# Patient Record
Sex: Female | Born: 1935 | Race: White | Hispanic: No | Smoking: Never smoker
Health system: Southern US, Community
[De-identification: ages and names within clinical notes are randomized; demographics above are authoritative.]

## PROBLEM LIST (undated history)

## (undated) DIAGNOSIS — C3490 Malignant neoplasm of unspecified part of unspecified bronchus or lung: Secondary | ICD-10-CM

## (undated) DIAGNOSIS — K219 Gastro-esophageal reflux disease without esophagitis: Secondary | ICD-10-CM

## (undated) DIAGNOSIS — I10 Essential (primary) hypertension: Secondary | ICD-10-CM

## (undated) DIAGNOSIS — N289 Disorder of kidney and ureter, unspecified: Secondary | ICD-10-CM

## (undated) DIAGNOSIS — N390 Urinary tract infection, site not specified: Secondary | ICD-10-CM

## (undated) DIAGNOSIS — E119 Type 2 diabetes mellitus without complications: Secondary | ICD-10-CM

## (undated) DIAGNOSIS — E1165 Type 2 diabetes mellitus with hyperglycemia: Secondary | ICD-10-CM

## (undated) DIAGNOSIS — J42 Unspecified chronic bronchitis: Secondary | ICD-10-CM

## (undated) DIAGNOSIS — I4891 Unspecified atrial fibrillation: Secondary | ICD-10-CM

## (undated) DIAGNOSIS — I498 Other specified cardiac arrhythmias: Secondary | ICD-10-CM

## (undated) DIAGNOSIS — E7800 Pure hypercholesterolemia, unspecified: Secondary | ICD-10-CM

## (undated) DIAGNOSIS — M1990 Unspecified osteoarthritis, unspecified site: Secondary | ICD-10-CM

## (undated) DIAGNOSIS — E079 Disorder of thyroid, unspecified: Secondary | ICD-10-CM

## (undated) DIAGNOSIS — E230 Hypopituitarism: Secondary | ICD-10-CM

## (undated) DIAGNOSIS — E237 Disorder of pituitary gland, unspecified: Secondary | ICD-10-CM

## (undated) HISTORY — DX: Disorder of kidney and ureter, unspecified: N28.9

## (undated) HISTORY — DX: Disorder of thyroid, unspecified: E07.9

## (undated) HISTORY — DX: Malignant neoplasm of unspecified part of unspecified bronchus or lung: C34.90

## (undated) HISTORY — DX: Urinary tract infection, site not specified: N39.0

## (undated) HISTORY — PX: KNEE SURGERY: SHX244

## (undated) HISTORY — DX: Type 2 diabetes mellitus with hyperglycemia: E11.65

## (undated) HISTORY — DX: Pure hypercholesterolemia, unspecified: E78.00

## (undated) HISTORY — DX: Other specified cardiac arrhythmias: I49.8

## (undated) HISTORY — DX: Hypopituitarism: E23.0

## (undated) HISTORY — DX: Disorder of pituitary gland, unspecified: E23.7

## (undated) HISTORY — PX: JOINT REPLACEMENT: SHX530

## (undated) HISTORY — DX: Unspecified osteoarthritis, unspecified site: M19.90

## (undated) HISTORY — DX: Unspecified chronic bronchitis: J42

---

## 2011-06-11 ENCOUNTER — Other Ambulatory Visit: Payer: Self-pay | Admitting: Internal Medicine

## 2011-06-11 DIAGNOSIS — R300 Dysuria: Secondary | ICD-10-CM

## 2011-06-11 DIAGNOSIS — N390 Urinary tract infection, site not specified: Secondary | ICD-10-CM

## 2011-06-18 ENCOUNTER — Other Ambulatory Visit: Payer: Self-pay

## 2011-06-26 ENCOUNTER — Ambulatory Visit
Admission: RE | Admit: 2011-06-26 | Discharge: 2011-06-26 | Disposition: A | Discharge status: Home / Self Care | Payer: Medicare Other | Source: Ambulatory Visit | Attending: Internal Medicine | Admitting: Internal Medicine

## 2011-06-26 DIAGNOSIS — R300 Dysuria: Secondary | ICD-10-CM

## 2011-06-26 DIAGNOSIS — N390 Urinary tract infection, site not specified: Secondary | ICD-10-CM

## 2012-02-10 ENCOUNTER — Encounter (HOSPITAL_BASED_OUTPATIENT_CLINIC_OR_DEPARTMENT_OTHER): Payer: Self-pay | Admitting: *Deleted

## 2012-02-10 ENCOUNTER — Emergency Department (HOSPITAL_BASED_OUTPATIENT_CLINIC_OR_DEPARTMENT_OTHER)
Admission: EM | Admit: 2012-02-10 | Discharge: 2012-02-10 | Disposition: A | Discharge status: Home / Self Care | Payer: Medicare Other | Attending: Emergency Medicine | Admitting: Emergency Medicine

## 2012-02-10 DIAGNOSIS — R109 Unspecified abdominal pain: Secondary | ICD-10-CM | POA: Insufficient documentation

## 2012-02-10 DIAGNOSIS — K219 Gastro-esophageal reflux disease without esophagitis: Secondary | ICD-10-CM | POA: Insufficient documentation

## 2012-02-10 DIAGNOSIS — E119 Type 2 diabetes mellitus without complications: Secondary | ICD-10-CM | POA: Insufficient documentation

## 2012-02-10 DIAGNOSIS — R300 Dysuria: Secondary | ICD-10-CM | POA: Insufficient documentation

## 2012-02-10 DIAGNOSIS — R350 Frequency of micturition: Secondary | ICD-10-CM | POA: Insufficient documentation

## 2012-02-10 DIAGNOSIS — Z79899 Other long term (current) drug therapy: Secondary | ICD-10-CM | POA: Insufficient documentation

## 2012-02-10 DIAGNOSIS — Z87440 Personal history of urinary (tract) infections: Secondary | ICD-10-CM | POA: Insufficient documentation

## 2012-02-10 DIAGNOSIS — R3589 Other polyuria: Secondary | ICD-10-CM | POA: Insufficient documentation

## 2012-02-10 DIAGNOSIS — I10 Essential (primary) hypertension: Secondary | ICD-10-CM | POA: Insufficient documentation

## 2012-02-10 DIAGNOSIS — R358 Other polyuria: Secondary | ICD-10-CM | POA: Insufficient documentation

## 2012-02-10 DIAGNOSIS — Z8679 Personal history of other diseases of the circulatory system: Secondary | ICD-10-CM | POA: Insufficient documentation

## 2012-02-10 HISTORY — DX: Unspecified atrial fibrillation: I48.91

## 2012-02-10 HISTORY — DX: Type 2 diabetes mellitus without complications: E11.9

## 2012-02-10 HISTORY — DX: Gastro-esophageal reflux disease without esophagitis: K21.9

## 2012-02-10 HISTORY — DX: Essential (primary) hypertension: I10

## 2012-02-10 LAB — CBC WITH DIFFERENTIAL/PLATELET
Hemoglobin: 13 g/dL (ref 12.0–15.0)
Lymphocytes Relative: 16 % (ref 12–46)
Lymphs Abs: 1.3 10*3/uL (ref 0.7–4.0)
Monocytes Relative: 7 % (ref 3–12)
Neutrophils Relative %: 75 % (ref 43–77)
Platelets: 228 10*3/uL (ref 150–400)
RBC: 4.5 MIL/uL (ref 3.87–5.11)
WBC: 8.1 10*3/uL (ref 4.0–10.5)

## 2012-02-10 LAB — BASIC METABOLIC PANEL
CO2: 24 mEq/L (ref 19–32)
Calcium: 9.2 mg/dL (ref 8.4–10.5)
GFR calc non Af Amer: 36 mL/min — ABNORMAL LOW (ref >90)
Potassium: 4.5 mEq/L (ref 3.5–5.1)
Sodium: 133 mEq/L — ABNORMAL LOW (ref 135–145)

## 2012-02-10 LAB — URINALYSIS, ROUTINE W REFLEX MICROSCOPIC
Glucose, UA: NEGATIVE mg/dL
Hgb urine dipstick: NEGATIVE
Protein, ur: NEGATIVE mg/dL
Specific Gravity, Urine: 1.012 (ref 1.005–1.030)

## 2012-02-10 LAB — URINE MICROSCOPIC-ADD ON

## 2012-02-10 MED ORDER — CEPHALEXIN 250 MG PO CAPS
500.0000 mg | ORAL_CAPSULE | Freq: Once | ORAL | Status: AC
  Administered 2012-02-10: 500 mg via ORAL
  Filled 2012-02-10 (×2): qty 1

## 2012-02-10 MED ORDER — CEPHALEXIN 500 MG PO CAPS: 500.0000 mg | ORAL_CAPSULE | Freq: Two times a day (BID) | ORAL | Status: AC

## 2012-02-10 MED ORDER — SODIUM CHLORIDE 0.9 % IV BOLUS (SEPSIS): 1000.0000 mL | Freq: Once | INTRAVENOUS | Status: DC

## 2012-02-10 NOTE — ED Notes (Signed)
MD at bedside. 

## 2012-02-10 NOTE — ED Notes (Addendum)
Windell Moulding RN at bedside now.

## 2012-02-10 NOTE — ED Notes (Signed)
In to start IV for NS bolus-pt states she was just up to BR and no void-daughter states she thought pt was getting abx and d/c-EDP Jeraldine Loots notified-states he will go in to talk with pt and daughter-pt and daughter notified

## 2012-02-10 NOTE — ED Notes (Signed)
Pt states unable to void x 24 hrs , sent here from urgent care fir eval

## 2012-02-10 NOTE — ED Notes (Signed)
Daughter upset about the wait. This RN in to room to adjust bed for pt and explain and apologize for delay. MD made aware of situation.

## 2012-02-10 NOTE — ED Provider Notes (Signed)
History     CSN: [id number]  Arrival date & time 02/10/12  1839   First MD Initiated Contact with Patient 02/10/12 1916      Chief Complaint  Patient presents with  . Urinary Retention    (Consider location/radiation/quality/duration/timing/severity/associated sxs/prior treatment) HPI The patient presents with a concern of one day of lack of urination.  She states that yesterday, without clear precipitant, she had polyuria.  Over the night, last night, she urinated multiple times, and was unable to sleep until approximately 12 hours ago.  Upon awakening approximately 6 hours ago, the patient felt that she was having difficulty producing urine.  There was lower abdominal discomfort.  These symptoms persisted throughout the time prior to arrival here, in spite of drinking copious amounts of fluids.  She arrival here, the patient purchased urine for a specimen.  She notes that she continues to have diffuse lower abdominal pain, this is nonradiating, not improved by, nor exacerbated by anything in particular. The patient denies fevers, chills, vomiting, diarrhea. The patient has a history of urinary tract infections, states that this feels somewhat similar. She denies other focal complaints, including chest pain, dyspnea, lightheadedness, syncope, weakness in any extremity.  Past Medical History  Diagnosis Date  . Diabetes mellitus without complication   . Hypertension   . Atrial fibrillation   . GERD (gastroesophageal reflux disease)     Past Surgical History  Procedure Date  . Joint replacement     History reviewed. No pertinent family history.  History  Substance Use Topics  . Smoking status: Never Smoker   . Smokeless tobacco: Not on file  . Alcohol Use: No    OB History    Grav Para Term Preterm Abortions TAB SAB Ect Mult Living                  Review of Systems  Constitutional:       Per HPI, otherwise negative  HENT:       Per HPI, otherwise negative  Eyes:  Negative.   Respiratory:       Per HPI, otherwise negative  Cardiovascular:       Per HPI, otherwise negative  Gastrointestinal: Negative for vomiting.  Genitourinary:       History of present illness  Musculoskeletal:       Per HPI, otherwise negative  Skin: Negative.   Neurological: Negative for syncope.    Allergies  Ciprofloxacin hcl; Codeine; and Sulfa antibiotics  Home Medications   Current Outpatient Rx  Name  Route  Sig  Dispense  Refill  . AMLODIPINE BESYLATE 5 MG PO TABS   Oral   Take 5 mg by mouth daily.         Marland Kitchen DILTIAZEM HCL ER BEADS 120 MG PO CP24   Oral   Take 120 mg by mouth 4 (four) times daily.         Marland Kitchen GLIPIZIDE 5 MG PO TABS   Oral   Take 5 mg by mouth 2 (two) times daily before a meal.         . HYDROCODONE-ACETAMINOPHEN 5-500 MG PO TABS   Oral   Take 1 tablet by mouth every 6 (six) hours as needed.         Marland Kitchen HYDROCORTISONE 5 MG PO TABS   Oral   Take 10 mg by mouth daily.         Marland Kitchen LEVOTHYROXINE SODIUM 75 MCG PO TABS   Oral   Take 75  mcg by mouth daily.         Marland Kitchen LISINOPRIL 40 MG PO TABS   Oral   Take 40 mg by mouth daily.         Marland Kitchen OMEPRAZOLE 40 MG PO CPDR   Oral   Take 40 mg by mouth daily.         . QUETIAPINE FUMARATE 50 MG PO TABS   Oral   Take 50 mg by mouth at bedtime.           BP 134/61  Pulse 65  Temp 98 F (36.7 C) (Oral)  Resp 18  Ht 5\' 3"  (1.6 m)  Wt 160 lb (72.576 kg)  BMI 28.34 kg/m2  SpO2 98%  Physical Exam  Nursing note and vitals reviewed. Constitutional: She is oriented to person, place, and time. She appears well-developed and well-nourished. No distress.  HENT:  Head: Normocephalic and atraumatic.  Eyes: Conjunctivae normal and EOM are normal.  Cardiovascular: Normal rate and regular rhythm.   Pulmonary/Chest: Effort normal and breath sounds normal. No stridor. No respiratory distress.  Abdominal: She exhibits no distension. There is tenderness in the right lower quadrant,  suprapubic area and left lower quadrant. There is guarding. There is no rigidity.  Musculoskeletal: She exhibits no edema.  Neurological: She is alert and oriented to person, place, and time. No cranial nerve deficit.  Skin: Skin is warm and dry.  Psychiatric: She has a normal mood and affect.    ED Course  [place] bedside Date/Time: 02/10/2012 7:50 PM Performed by: Gerhard Munch Authorized by: Gerhard Munch Consent: Verbal consent obtained. Risks and benefits: risks, benefits and alternatives were discussed Consent given by: patient Patient identity confirmed: verbally with patient Time out: Immediately prior to procedure a "time out" was called to verify the correct patient, procedure, equipment, support staff and site/side marked as required. Preparation: Patient was prepped and draped in the usual sterile fashion. Local anesthesia used: no Patient sedated: no Patient tolerance: Patient tolerated the procedure well with no immediate complications. Comments: Ultrasound of lower abdomen demonstrates no free fluid intraperitoneally, trace amounts of urine in the bladder, no pain with palpation of the bladder. Procedure well tolerated   (including critical care time)  Labs Reviewed  URINALYSIS, ROUTINE W REFLEX MICROSCOPIC - Abnormal; Notable for the following:    APPearance CLOUDY (*)     Leukocytes, UA LARGE (*)     All other components within normal limits  URINE MICROSCOPIC-ADD ON - Abnormal; Notable for the following:    Bacteria, UA FEW (*)     All other components within normal limits  CBC WITH DIFFERENTIAL  BASIC METABOLIC PANEL  URINE CULTURE   No results found.   No diagnosis found.   9:08 PM Patient's labs demonstrate a creatinine of 1.3, with no prior lab values.  Given oliguria, there suspicion of dehydration.  NS started  Update: Patient's daughter requests ABX, no IVF.  She seems upset at time delays, though the patient has been here for less than three  hours.  She requests expedited departure. MDM  This elderly female presents with one day of decreased urination, dysuria and general abdominal discomfort.  On exam she is in no distress, with unremarkable vital signs.  The patient's abdomen is soft, with minimal tenderness to palpation about the lower abdomen.  At that ultrasound does not demonstrate urinary retention, and the patient was procedure involving the emergency department.  This decreased production, as well as the patient's endorsement of  polyuria in the day proceeds is suggestive of urethritis, with consideration of dehydration.  The patient's marginally elevated creatinine substantiates this.  Absent fever, distress, there is low suspicion for acute ongoing systemic infection, though the urine was suggestive of urinary tract infection.  The patient requested antibiotics for this, and this was accommodated.  After the patient's daughter requested expedited discharge, this was also accommodated.  With the patient's age, comorbidities, early possible UTI, urethritis, she was encouraged to follow up with her primary care physician tomorrow to insure appropriate ongoing care.        Gerhard Munch, MD 02/10/12 2203

## 2012-02-10 NOTE — ED Notes (Signed)
MD and Manuela Schwartz at bedside now.

## 2012-02-12 LAB — URINE CULTURE

## 2012-02-13 NOTE — ED Notes (Signed)
+  Urine. Patient treated with Keflex. Sensitive to same. Per protocol MD. °

## 2012-02-19 ENCOUNTER — Telehealth: Payer: Self-pay | Admitting: Internal Medicine

## 2012-02-19 NOTE — Telephone Encounter (Signed)
Pt is the mother of Garey Ham. Pt's daughter would like to know if you would except her as a patient.  She has Medicare [facility] supplement. Thank you.

## 2012-02-26 NOTE — Telephone Encounter (Signed)
Pt aware/kjh 

## 2012-02-26 NOTE — Telephone Encounter (Signed)
Not currently taking new medicare patients   . 

## 2012-04-11 ENCOUNTER — Encounter: Payer: Medicare Other | Admitting: Family Medicine
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
## 2012-04-12 ENCOUNTER — Ambulatory Visit: Payer: Medicare Other | Admitting: Family Medicine

## 2012-04-25 ENCOUNTER — Other Ambulatory Visit: Payer: Self-pay | Admitting: Internal Medicine

## 2012-05-16 ENCOUNTER — Other Ambulatory Visit: Payer: Self-pay

## 2012-05-16 ENCOUNTER — Ambulatory Visit (INDEPENDENT_AMBULATORY_CARE_PROVIDER_SITE_OTHER): Payer: Medicare Other | Admitting: Family Medicine

## 2012-05-16 ENCOUNTER — Encounter: Payer: Self-pay | Admitting: Family Medicine

## 2012-05-16 VITALS — BP 100/70 | HR 84 | Temp 98.8°F | Ht 62.75 in | Wt 166.0 lb

## 2012-05-16 DIAGNOSIS — L989 Disorder of the skin and subcutaneous tissue, unspecified: Secondary | ICD-10-CM

## 2012-05-16 DIAGNOSIS — R350 Frequency of micturition: Secondary | ICD-10-CM

## 2012-05-16 DIAGNOSIS — IMO0001 Reserved for inherently not codable concepts without codable children: Secondary | ICD-10-CM

## 2012-05-16 DIAGNOSIS — I10 Essential (primary) hypertension: Secondary | ICD-10-CM | POA: Insufficient documentation

## 2012-05-16 DIAGNOSIS — E785 Hyperlipidemia, unspecified: Secondary | ICD-10-CM

## 2012-05-16 DIAGNOSIS — N390 Urinary tract infection, site not specified: Secondary | ICD-10-CM

## 2012-05-16 DIAGNOSIS — C3490 Malignant neoplasm of unspecified part of unspecified bronchus or lung: Secondary | ICD-10-CM

## 2012-05-16 DIAGNOSIS — E230 Hypopituitarism: Secondary | ICD-10-CM | POA: Insufficient documentation

## 2012-05-16 HISTORY — DX: Hypopituitarism: E23.0

## 2012-05-16 HISTORY — DX: Reserved for inherently not codable concepts without codable children: IMO0001

## 2012-05-16 LAB — BASIC METABOLIC PANEL
CO2: 31 mEq/L (ref 19–32)
Chloride: 98 mEq/L (ref 96–112)
GFR: 37.97 mL/min — ABNORMAL LOW (ref >60.00)
Glucose, Bld: 157 mg/dL — ABNORMAL HIGH (ref 70–99)
Potassium: 4.8 mEq/L (ref 3.5–5.1)
Sodium: 135 mEq/L (ref 135–145)

## 2012-05-16 LAB — LIPID PANEL
HDL: 51.1 mg/dL (ref >39.00)
LDL Cholesterol: 85 mg/dL (ref 0–99)
VLDL: 38.2 mg/dL (ref 0.0–40.0)

## 2012-05-16 LAB — HEMOGLOBIN A1C: Hgb A1c MFr Bld: 6.9 % — ABNORMAL HIGH (ref 4.6–6.5)

## 2012-05-16 LAB — POCT URINALYSIS DIPSTICK
Bilirubin, UA: NEGATIVE
Spec Grav, UA: 1.02
Urobilinogen, UA: 0.2

## 2012-05-16 MED ORDER — CEFIXIME 400 MG PO TABS: 400.0000 mg | ORAL_TABLET | Freq: Every day | ORAL | Status: DC

## 2012-05-16 NOTE — Addendum Note (Signed)
Addended by: Azucena Freed on: 05/16/2012 01:59 PM   Modules accepted: Orders

## 2012-05-16 NOTE — Progress Notes (Signed)
Chief Complaint  Patient presents with  . Establish Care    HPI:  [name] [name] is here to establish care. Moved to [place] from [place] a year ago. Saw a PCP here, but did like her (can not remember the name of this doctor.)  She has poor memory and is a very poor historian. She reports she feels fine other then dysuria. Denies CP, SOB, palpitations, swelling, fevers, chills, GI bleeding, bowel changes.  Has the following chronic problems and concerns today:  Dysuria: -has had dysuria for a few days - hurts when urinates -reports went to urgent care place in the past and given abx -has had dysuria on and off for about 1 year - also told needs to drink more water and reports recent PCP told her it was not a UTI and was her nerves - this made her upset - she wants to see a urologist  DM: -home BS: normally 120-180 after eating breakfast -takes glipizide for her diabetes  Panhypopituitarism/Hypothyroid: -reports has pituitary issue and saw endocrinologist in the past for 20 years for this and her diabetes -takes hydrocortisone 20mg  daily in the am and 10mg  in the evening -wants to see endocrinologist here  Paroxsysmal A. Fib: -on diltiazem, report in the hospital for this in the past -reports might have taken asa, but thinks stopped due to nose bleed - she just stopped it -she stopped it  Skin lesion: -on cheek - has been there a long time - wants to see neurologist  Insomnia: -reports previous doc  OA: -knee pain -takes vicodin once daily, this does cause constipation   Patient Active Problem List  Diagnosis  . Type II or unspecified type diabetes mellitus without mention of complication, uncontrolled  . Lung cancer - followed by Dr. Mardi Mainland at [facility]  . Essential hypertension, benign  . Hyperlipemia  . Panhypopituitarism     Health Maintenance: -reports has had pneumonia vaccine in the last 3 years, can't remember last tdap -colonoscopy in 2011 -mammo -  stopped getting these  ROS: See pertinent positives and negatives per HPI.  Past Medical History  Diagnosis Date  . Diabetes mellitus without complication   . Hypertension   . Atrial fibrillation   . GERD (gastroesophageal reflux disease)   . 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 Arthritis   . Lung cancer   . Chronic bronchitis   . Heart fibrillation   . Hypertension   . High cholesterol   . Kidney disease   . Thyroid disease   . UTI (urinary tract infection)   . 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 Type II or unspecified type diabetes mellitus without mention of complication, uncontrolled 05/16/2012    Family History  Problem Relation Age of Onset  . Alcohol abuse      parent  . Arthritis      parent  . Lung cancer    . High Cholesterol    . Heart disease    . Hypertension    . Emotional abuse    . Diabetes      History   Social History  . Marital Status: Widowed    Spouse Name: N/A    Number of Children: N/A  . Years of Education: N/A   Social History Main Topics  . Smoking status: Never Smoker   . Smokeless tobacco: None  . Alcohol Use: No  . Drug Use: No  . Sexually Active: No   Other Topics Concern  .  None   Social History Narrative   Work or School: none      Home Situation: lives alone      Spiritual Beliefs: has not been going to church      Lifestyle: no regular exercise, eats out usually, takes care of of her own bathing and dressing, daughter is taking care of finances   HCPOA: Garey Ham [phone]             Current outpatient prescriptions:amLODipine (NORVASC) 5 MG tablet, TAKE 1 TABLET BY MOUTH DAILY, Disp: 90 tablet, Rfl: 3;  atorvastatin (LIPITOR) 10 MG tablet, Take 10 mg by mouth daily., Disp: , Rfl: ;  Calcium Polycarbophil (FIBER) 625 MG TABS, Take 2 tablets by mouth at bedtime., Disp: , Rfl: ;  diltiazem (TIAZAC) 120 MG 24 hr capsule, Take 120 mg by mouth 4 (four) times daily., Disp: , Rfl:  glipiZIDE  (GLUCOTROL) 5 MG tablet, Take 5 mg by mouth 2 (two) times daily before a meal., Disp: , Rfl: ;  HYDROcodone-acetaminophen (VICODIN) 5-500 MG per tablet, Take 1 tablet by mouth every 6 (six) hours as needed., Disp: , Rfl: ;  hydrocortisone (CORTEF) 5 MG tablet, Take 20 mg by mouth. Take 1 tablet by mouth every morning and 1/2 tablet every evening., Disp: , Rfl:  levothyroxine (SYNTHROID, LEVOTHROID) 75 MCG tablet, Take 75 mcg by mouth daily., Disp: , Rfl: ;  lisinopril (PRINIVIL,ZESTRIL) 40 MG tablet, Take 40 mg by mouth daily., Disp: , Rfl: ;  omeprazole (PRILOSEC) 40 MG capsule, Take 40 mg by mouth daily., Disp: , Rfl: ;  QUEtiapine (SEROQUEL) 50 MG tablet, Take 50 mg by mouth at bedtime., Disp: , Rfl: ;  cefixime (SUPRAX) 400 MG tablet, Take 1 tablet (400 mg total) by mouth daily., Disp: 5 tablet, Rfl: 0  EXAM:  Filed Vitals:   05/16/12 1135  BP: 100/70  Pulse: 84  Temp: 98.8 F (37.1 C)    Body mass index is 29.63 kg/(m^2).  GENERAL: vitals reviewed and listed above, alert, oriented, appears well hydrated and in no acute distress  HEENT: atraumatic, conjunttiva clear, no obvious abnormalities on inspection of external nose and ears  NECK: no obvious masses on inspection  LUNGS: clear to auscultation bilaterally, no wheezes, rales or rhonchi, good air movement  CV: HRRR, no peripheral edema  MS: moves all extremities without noticeable abnormality  PSYCH: pleasant and cooperative, no obvious depression or anxiety  ASSESSMENT AND PLAN:  Discussed the following assessment and plan:  Urinary frequency - Plan: POCT urinalysis dipstick, cefixime (SUPRAX) 400 MG tablet  Type II or unspecified type diabetes mellitus without mention of complication, uncontrolled - Plan: Ambulatory referral to Endocrinology, Hemoglobin A1c  Lung cancer, unspecified laterality  Essential hypertension, benign - Plan: Basic metabolic panel  Hyperlipemia - Plan: Lipid Panel  Panhypopituitarism - Plan:  Ambulatory referral to Endocrinology  UTI (urinary tract infection)  Skin lesion - Plan: Ambulatory referral to Dermatology   Very complicated patient with poor hearing, possible dementia and poor understanding of health issues. Advised she see a psychiatrist.  We will obtain records and I am going to call her daughter to try to figure out more about what is going on with her health and to advise a geriatrician. Have advised asa daily in the meantime, seems from her story she stopped this on her own. Placed referrals per her request for endocrinology for her ?panhypopit and diabetes, and to derm for lesion on her face. Udip with likely UTI - abx prescribed  and culture pending Baseline labs today per orders. Advised seroquel may not be the safest medication for her sleep issues and advised she see psych for this. Adivsed weaning down on vicodin as is causing bowel issues and is not a good medication for chornic MS pain in the elderly particularly. I feel given her complexity and age she would do better with a geriatrician and have advised this - will call daughter and make this recommendation. Will have her follow up with me in the meantime until establishes with a geriatrician. >45 minutes spent face to face with this patient -Patient advised to return or notify a doctor immediately if symptoms worsen or persist or new concerns arise.  Patient Instructions  -We have ordered labs or studies at this visit. It can take up to 1-2 weeks for results and processing. We will contact you with instructions IF your results are abnormal. Normal results will be released to your [facility]. If you have not heard from [place] or can not find your results in [facility] in 2 weeks please contact our office.  -PLEASE SIGN UP FOR MYCHART TODAY   We recommend the following healthy lifestyle measures: - eat a healthy diet consisting of lots of vegetables, fruits, beans, nuts, seeds, healthy meats such as white chicken and  fish and whole grains.  - avoid fried foods, fast food, processed foods, sodas, red meet and other fattening foods.  - get a least 150 minutes of aerobic exercise per week.   Start taking 1 aspirin daily  Decrease the vicodin to every other day, take just tylenol on days you do not take this, we will wean off this medication  Stop the seroquel. Schedule appointment with psychiatry for your sleep issues.  Follow up in: 1 month    Addendum: Spoke with daughter. She does not think patient has dementia and thinks she is fine to drive. She reports patient had a geriatrician - but can not remember the name and patient had difficulty finding this office so did not want to go there anymore. Advised she needs to re-estabish with a geriatrician. Advised of recs from today. Will have scheduler contact daughter with details regarding referral appointments.    Kriste Basque R.

## 2012-05-16 NOTE — Addendum Note (Signed)
Addended by: Rita Ohara R on: 05/16/2012 02:05 PM   Modules accepted: Orders

## 2012-05-16 NOTE — Patient Instructions (Addendum)
-  We have ordered labs or studies at this visit. It can take up to 1-2 weeks for results and processing. We will contact you with instructions IF your results are abnormal. Normal results will be released to your [facility]. If you have not heard from [place] or can not find your results in [facility] in 2 weeks please contact our office.  -PLEASE SIGN UP FOR MYCHART TODAY   We recommend the following healthy lifestyle measures: - eat a healthy diet consisting of lots of vegetables, fruits, beans, nuts, seeds, healthy meats such as white chicken and fish and whole grains.  - avoid fried foods, fast food, processed foods, sodas, red meet and other fattening foods.  - get a least 150 minutes of aerobic exercise per week.   Start taking 1 aspirin daily  Decrease the vicodin to every other day, take just tylenol on days you do not take this, we will wean off this medication  Stop the seroquel. Schedule appointment with psychiatry for your sleep issues.  Follow up in: 1 month

## 2012-05-17 ENCOUNTER — Telehealth: Payer: Self-pay | Admitting: Family Medicine

## 2012-05-17 MED ORDER — NITROFURANTOIN MONOHYD MACRO 100 MG PO CAPS: 100.0000 mg | ORAL_CAPSULE | Freq: Three times a day (TID) | ORAL | Status: DC

## 2012-05-17 NOTE — Telephone Encounter (Signed)
Can do nitrofurantoin instead. Also, remind about need to establish with geriatrician. Discussed with daughter yesterday. Will help out until back with geriatrician. Thanks.

## 2012-05-17 NOTE — Telephone Encounter (Signed)
Pls advise.  

## 2012-05-17 NOTE — Telephone Encounter (Signed)
Called and spoke with pt's daughter and she is aware.  States pt went to pharmacy to get new medication this morning and advised medication was sent to pharmacy this afternoon.

## 2012-05-17 NOTE — Telephone Encounter (Addendum)
Pt can not afford suprax cost over 98.00. Please call something else into [facility] [place]

## 2012-05-17 NOTE — Telephone Encounter (Signed)
Called and spoke with pt's daughter and she is aware.

## 2012-05-17 NOTE — Telephone Encounter (Signed)
Patient calling back to see if decision was made regarding her antibiotic.  It was too expensive for the suprax $98.00.  She states she talked with someone in the office about an alternative and called her pharmacy and nothing has been called in yet.  Advised [name] [name] to please allow office time to discuss with Dr. Selena Batten. Understanding expressed. She would like a call when medication is sent.   [name] [name]-  [phone]

## 2012-05-17 NOTE — Telephone Encounter (Signed)
Labs are stable from a few months ago. Diabetes lab 6.9, continue currrent treatment. Kidney function about the same - make sure staying hydrated. Cholesterol ok.

## 2012-05-18 NOTE — Telephone Encounter (Signed)
Called and spoke with pt's daughter (ok per patient verbally). Pt is has difficulty hearing.  Pt's daughter is aware of lab results.

## 2012-05-18 NOTE — Telephone Encounter (Signed)
Left a message for return call.  

## 2012-05-23 ENCOUNTER — Telehealth: Payer: Self-pay | Admitting: Family Medicine

## 2012-05-23 NOTE — Telephone Encounter (Signed)
Please call daughter and patient. Patient needs to see a doctor have repeat urine and may be dehydrated - advise given phone message symptoms she needs to go to ED if feeling that bad - she could be seriously ill.

## 2012-05-23 NOTE — Telephone Encounter (Signed)
I spoke with pt's daughter and gave below information.

## 2012-05-23 NOTE — Telephone Encounter (Signed)
Patient Information:  Caller Name: Aleatha  Phone: [phone]  Patient: [name] [name]  Gender: Female  DOB: 02/24/1921  Age: 79 Years  PCP: Kriste Basque [facility])  Office Follow Up:  Does the office need to follow up with this patient?: Yes  Instructions For The Office: OFFICE PLEASE REVIEW AND CALL PATIENT WITH FURTHER INSTRUCTIONS  RN Note:  Offered appt but she states that she is too nauseated to get up and get dressed; family is at work now; pt states that she told the MD that she will need two rounds of antibiotic to help the UTI sx; but she can not take this current antibiotic that she is on.  Suggested calling 911 for transport and going into ED for treatment; pt refusing; states that she wants MD to call her in another antiboitc because one never helps  Explained that I will send a note for MD to review but if sx worsen call back or 911- pt responds that "I don't think the sx could get much worse"  Symptoms  Reason For Call & Symptoms: Pt states that she was seen in the office on 05/16/12 and dx with UTI; started  on antibiotic, Nitrofurantoin 100mg  threetimes daily for 7 days on 05/17/12; she is upset on her stomach from this medicine; refusing to take anymore of this medication;  last dose she took was today;  very nauseated; denies vomiting; taking Phenergan but not helping nausea; still having pain with urination and hard time sleeping from the increase in frequency; unsure if has a fever; no thermometer;  feels warm to touch per pt;  having severe pain with urination; pt is very hard of hearing; difficult to triage patient due to poor hearing  Reviewed Health History In EMR: Yes  Reviewed Medications In EMR: Yes  Reviewed Allergies In EMR: Yes  Reviewed Surgeries / Procedures: Yes  Date of Onset of Symptoms: 05/16/2012  Treatments Tried: Nitrofurantoin and Promethazine  Treatments Tried Worked: No  Guideline(s) Used:  Urination Pain - Female  Urinalysis Results  Follow-Up Call  Disposition Per Guideline:   Go to Office Now  Reason For Disposition Reached:   Severe pain with urination  Advice Given:  N/A  Patient Refused Recommendation:  Patient Requests Prescription  Pt is requesting antibiotic to be called in since she was just seen in the office and states that she is too nasueated to get dressed to come in to be seen

## 2012-05-23 NOTE — Telephone Encounter (Signed)
pls advise

## 2012-06-02 ENCOUNTER — Ambulatory Visit: Payer: Medicare Other | Admitting: Internal Medicine

## 2012-06-12 ENCOUNTER — Other Ambulatory Visit: Payer: Self-pay | Admitting: Internal Medicine

## 2012-06-15 ENCOUNTER — Other Ambulatory Visit: Payer: Self-pay

## 2012-06-15 MED ORDER — GLIPIZIDE ER 5 MG PO TB24: 5.0000 mg | ORAL_TABLET | Freq: Every day | ORAL | Status: DC

## 2012-06-16 ENCOUNTER — Ambulatory Visit: Payer: Medicare Other | Admitting: Family Medicine

## 2012-09-15 ENCOUNTER — Other Ambulatory Visit: Payer: Self-pay | Admitting: Family Medicine

## 2012-09-15 NOTE — Telephone Encounter (Signed)
Advised this patient and daughter to re-establish with her geriatrician or an internist for primary care. Would advise refill request be sent PCP.

## 2012-10-07 ENCOUNTER — Other Ambulatory Visit: Payer: Self-pay | Admitting: Internal Medicine

## 2012-10-07 NOTE — Telephone Encounter (Signed)
[name] [name],  I do not usually prescribe this medication, particularly in elderly pts and have not seen this pt in a very long time. I will not refill and advise she see geriatrician or psychiatrist. I advised this patient to re-establish with her geriatrician and advised would provide care short term until she got back with her geriatrician. We have advised her and her daughter of this multiple times. Please send discharge letter.Thanks.

## 2012-10-11 ENCOUNTER — Encounter: Payer: Self-pay | Admitting: Family Medicine

## 2012-10-12 ENCOUNTER — Other Ambulatory Visit: Payer: Self-pay | Admitting: *Deleted

## 2012-10-12 MED ORDER — QUETIAPINE FUMARATE 50 MG PO TABS: 50.0000 mg | ORAL_TABLET | Freq: Every day | ORAL | Status: AC

## 2012-10-13 ENCOUNTER — Telehealth: Payer: Self-pay | Admitting: Family Medicine

## 2012-10-13 NOTE — Telephone Encounter (Addendum)
Patient dismissed from [facility] by Kriste Basque MD , effective October 10, 2012. Dismissal letter sent out by certified / registered mail. DAJ  Received signed domestic return receipt verifying delivery of certified letter on October 17, 2012. Article number [id number]

## 2012-10-14 ENCOUNTER — Other Ambulatory Visit: Payer: Self-pay | Admitting: Internal Medicine

## 2012-12-18 ENCOUNTER — Encounter (HOSPITAL_COMMUNITY): Payer: Self-pay | Admitting: Emergency Medicine

## 2012-12-18 ENCOUNTER — Inpatient Hospital Stay (HOSPITAL_COMMUNITY)
Admission: EM | Admit: 2012-12-18 | Discharge: 2012-12-23 | DRG: 378 | Disposition: A | Discharge status: Home / Self Care | Payer: Medicare Other | Attending: Internal Medicine | Admitting: Internal Medicine

## 2012-12-18 ENCOUNTER — Emergency Department (HOSPITAL_COMMUNITY): Payer: Medicare Other

## 2012-12-18 DIAGNOSIS — I129 Hypertensive chronic kidney disease with stage 1 through stage 4 chronic kidney disease, or unspecified chronic kidney disease: Secondary | ICD-10-CM | POA: Diagnosis present

## 2012-12-18 DIAGNOSIS — N390 Urinary tract infection, site not specified: Secondary | ICD-10-CM | POA: Diagnosis present

## 2012-12-18 DIAGNOSIS — E7800 Pure hypercholesterolemia, unspecified: Secondary | ICD-10-CM | POA: Diagnosis present

## 2012-12-18 DIAGNOSIS — K921 Melena: Secondary | ICD-10-CM

## 2012-12-18 DIAGNOSIS — K264 Chronic or unspecified duodenal ulcer with hemorrhage: Principal | ICD-10-CM

## 2012-12-18 DIAGNOSIS — Z923 Personal history of irradiation: Secondary | ICD-10-CM

## 2012-12-18 DIAGNOSIS — N183 Chronic kidney disease, stage 3 unspecified: Secondary | ICD-10-CM

## 2012-12-18 DIAGNOSIS — Z79899 Other long term (current) drug therapy: Secondary | ICD-10-CM

## 2012-12-18 DIAGNOSIS — E785 Hyperlipidemia, unspecified: Secondary | ICD-10-CM

## 2012-12-18 DIAGNOSIS — C3490 Malignant neoplasm of unspecified part of unspecified bronchus or lung: Secondary | ICD-10-CM | POA: Diagnosis present

## 2012-12-18 DIAGNOSIS — I4891 Unspecified atrial fibrillation: Secondary | ICD-10-CM | POA: Diagnosis present

## 2012-12-18 DIAGNOSIS — D649 Anemia, unspecified: Secondary | ICD-10-CM

## 2012-12-18 DIAGNOSIS — D62 Acute posthemorrhagic anemia: Secondary | ICD-10-CM | POA: Diagnosis present

## 2012-12-18 DIAGNOSIS — N179 Acute kidney failure, unspecified: Secondary | ICD-10-CM | POA: Diagnosis present

## 2012-12-18 DIAGNOSIS — J42 Unspecified chronic bronchitis: Secondary | ICD-10-CM | POA: Diagnosis present

## 2012-12-18 DIAGNOSIS — I10 Essential (primary) hypertension: Secondary | ICD-10-CM

## 2012-12-18 DIAGNOSIS — K219 Gastro-esophageal reflux disease without esophagitis: Secondary | ICD-10-CM | POA: Diagnosis present

## 2012-12-18 DIAGNOSIS — G2581 Restless legs syndrome: Secondary | ICD-10-CM

## 2012-12-18 DIAGNOSIS — I2489 Other forms of acute ischemic heart disease: Secondary | ICD-10-CM

## 2012-12-18 DIAGNOSIS — M129 Arthropathy, unspecified: Secondary | ICD-10-CM | POA: Diagnosis present

## 2012-12-18 DIAGNOSIS — K922 Gastrointestinal hemorrhage, unspecified: Secondary | ICD-10-CM

## 2012-12-18 DIAGNOSIS — E230 Hypopituitarism: Secondary | ICD-10-CM

## 2012-12-18 DIAGNOSIS — IMO0001 Reserved for inherently not codable concepts without codable children: Secondary | ICD-10-CM

## 2012-12-18 DIAGNOSIS — I248 Other forms of acute ischemic heart disease: Secondary | ICD-10-CM | POA: Diagnosis present

## 2012-12-18 LAB — COMPREHENSIVE METABOLIC PANEL
CO2: 20 mEq/L (ref 19–32)
Calcium: 8.7 mg/dL (ref 8.4–10.5)
Creatinine, Ser: 1.49 mg/dL — ABNORMAL HIGH (ref 0.50–1.10)
GFR calc Af Amer: 35 mL/min — ABNORMAL LOW (ref >90)
GFR calc non Af Amer: 30 mL/min — ABNORMAL LOW (ref >90)
Glucose, Bld: 202 mg/dL — ABNORMAL HIGH (ref 70–99)

## 2012-12-18 LAB — GLUCOSE, CAPILLARY
Glucose-Capillary: 199 mg/dL — ABNORMAL HIGH (ref 70–99)
Glucose-Capillary: 168 mg/dL — ABNORMAL HIGH (ref 70–99)

## 2012-12-18 LAB — CBC WITH DIFFERENTIAL/PLATELET
Eosinophils Relative: 1 % (ref 0–5)
HCT: 18.9 % — ABNORMAL LOW (ref 36.0–46.0)
Lymphocytes Relative: 15 % (ref 12–46)
Lymphs Abs: 1.9 10*3/uL (ref 0.7–4.0)
MCV: 90.9 fL (ref 78.0–100.0)
Monocytes Absolute: 0.3 10*3/uL (ref 0.1–1.0)
RDW: 16 % — ABNORMAL HIGH (ref 11.5–15.5)
WBC: 12.6 10*3/uL — ABNORMAL HIGH (ref 4.0–10.5)

## 2012-12-18 LAB — POCT I-STAT TROPONIN I: Troponin i, poc: 0 ng/mL (ref 0.00–0.08)

## 2012-12-18 LAB — URINALYSIS, ROUTINE W REFLEX MICROSCOPIC
Protein, ur: NEGATIVE mg/dL
Urobilinogen, UA: 0.2 mg/dL (ref 0.0–1.0)

## 2012-12-18 LAB — OCCULT BLOOD, POC DEVICE: Fecal Occult Bld: POSITIVE — AB

## 2012-12-18 LAB — APTT: aPTT: 28 seconds (ref 24–37)

## 2012-12-18 LAB — TROPONIN I: Troponin I: <0.3 ng/mL (ref <0.30)

## 2012-12-18 LAB — PROTIME-INR: Prothrombin Time: 14.3 seconds (ref 11.6–15.2)

## 2012-12-18 LAB — URINE MICROSCOPIC-ADD ON

## 2012-12-18 LAB — PREPARE RBC (CROSSMATCH)

## 2012-12-18 MED ORDER — HYDROCORTISONE 10 MG PO TABS
10.0000 mg | ORAL_TABLET | Freq: Every day | ORAL | Status: DC
  Administered 2012-12-19 – 2012-12-23 (×5): 10 mg via ORAL
  Filled 2012-12-18 (×5): qty 1

## 2012-12-18 MED ORDER — DILTIAZEM HCL ER COATED BEADS 120 MG PO CP24
120.0000 mg | ORAL_CAPSULE | Freq: Every day | ORAL | Status: DC
  Administered 2012-12-19 – 2012-12-20 (×2): 120 mg via ORAL
  Filled 2012-12-18 (×3): qty 1

## 2012-12-18 MED ORDER — INSULIN ASPART 100 UNIT/ML ~~LOC~~ SOLN
0.0000 [IU] | Freq: Three times a day (TID) | SUBCUTANEOUS | Status: DC
  Administered 2012-12-18: 19:00:00 1 [IU] via SUBCUTANEOUS
  Administered 2012-12-19 – 2012-12-20 (×2): 2 [IU] via SUBCUTANEOUS

## 2012-12-18 MED ORDER — SODIUM CHLORIDE 0.9 % IV SOLN: INTRAVENOUS | Status: DC

## 2012-12-18 MED ORDER — DEXTROSE 5 % IV SOLN
1.0000 g | INTRAVENOUS | Status: DC
  Administered 2012-12-18 – 2012-12-22 (×5): 1 g via INTRAVENOUS
  Filled 2012-12-18 (×6): qty 10

## 2012-12-18 MED ORDER — QUETIAPINE FUMARATE 50 MG PO TABS
50.0000 mg | ORAL_TABLET | Freq: Every day | ORAL | Status: DC
  Administered 2012-12-18 – 2012-12-22 (×5): 50 mg via ORAL
  Filled 2012-12-18 (×6): qty 1

## 2012-12-18 MED ORDER — ACETAMINOPHEN 650 MG RE SUPP: 650.0000 mg | Freq: Four times a day (QID) | RECTAL | Status: DC | PRN

## 2012-12-18 MED ORDER — SODIUM CHLORIDE 0.9 % IJ SOLN
3.0000 mL | Freq: Two times a day (BID) | INTRAMUSCULAR | Status: DC
  Administered 2012-12-18 – 2012-12-22 (×6): 3 mL via INTRAVENOUS

## 2012-12-18 MED ORDER — SODIUM CHLORIDE 0.9 % IV SOLN
INTRAVENOUS | Status: DC
  Administered 2012-12-18: 18:00:00 via INTRAVENOUS

## 2012-12-18 MED ORDER — HYDROCODONE-ACETAMINOPHEN 5-325 MG PO TABS: 1.0000 | ORAL_TABLET | ORAL | Status: DC | PRN

## 2012-12-18 MED ORDER — ONDANSETRON HCL 4 MG/2ML IJ SOLN: 4.0000 mg | Freq: Four times a day (QID) | INTRAMUSCULAR | Status: DC | PRN

## 2012-12-18 MED ORDER — PANTOPRAZOLE SODIUM 40 MG IV SOLR
40.0000 mg | Freq: Two times a day (BID) | INTRAVENOUS | Status: DC
  Administered 2012-12-18 – 2012-12-19 (×3): 40 mg via INTRAVENOUS
  Filled 2012-12-18 (×6): qty 40

## 2012-12-18 MED ORDER — ACETAMINOPHEN 325 MG PO TABS
650.0000 mg | ORAL_TABLET | Freq: Four times a day (QID) | ORAL | Status: DC | PRN
  Administered 2012-12-18: 650 mg via ORAL
  Filled 2012-12-18: qty 2

## 2012-12-18 MED ORDER — ALUM & MAG HYDROXIDE-SIMETH 200-200-20 MG/5ML PO SUSP: 30.0000 mL | Freq: Four times a day (QID) | ORAL | Status: DC | PRN

## 2012-12-18 MED ORDER — HYDROCORTISONE 20 MG PO TABS
20.0000 mg | ORAL_TABLET | Freq: Every day | ORAL | Status: DC
  Administered 2012-12-18 – 2012-12-23 (×6): 20 mg via ORAL
  Filled 2012-12-18 (×7): qty 1

## 2012-12-18 MED ORDER — ONDANSETRON HCL 4 MG PO TABS
4.0000 mg | ORAL_TABLET | Freq: Four times a day (QID) | ORAL | Status: DC | PRN
  Administered 2012-12-21: 4 mg via ORAL
  Filled 2012-12-18 (×2): qty 1

## 2012-12-18 NOTE — ED Notes (Signed)
Dr. Plunkett at bedside.  

## 2012-12-18 NOTE — ED Notes (Addendum)
CRITICAL HGB 6.4 from Lab Dr. Anitra Lauth notified

## 2012-12-18 NOTE — ED Notes (Signed)
PT presents from home with c/o SOB that began at 1000 after having a BM. Per EMS pt denies CP but states that she felt a "pressure" that she cannot rate on a 0-10scale. Pt was noted to have expiratory wheezes bilaterally. Pt given 5mg  Albuterol and 0.5mg  atrovent PTA, per EMS pt's lungs now clear.  Pt A&Ox4, interactive, and calm, in NAD at this time.

## 2012-12-18 NOTE — ED Notes (Signed)
Unable to obtain bloodwork. Phlebotomy notified

## 2012-12-18 NOTE — Consult Note (Signed)
Consult for [facility]  Reason for Consult: GI bleed Referring Physician: [facility]  [name] [name] HPI: This is an 79 year old female with a PMH of DM, HTN, and atrial fibrillation admitted for melena and an HGB of 6.4.  In January her HGB was noted to be at 13.0.  She started to have issues with DOE 5 days ago and it was associated with melena.  No prior history of GI bleed.  On occassions she will use hydrocodone and Aleve for pain symptoms if she is going to exert herself.  There is a history of lung cancer, but I do not know the type of cancer.  Upon admission she did have issues with chest heaviness that lasted for 15 minutes, but it resolved spontaneously.    Past Medical History  Diagnosis Date  . Diabetes mellitus without complication   . Hypertension   . Atrial fibrillation   . GERD (gastroesophageal reflux disease)   . 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 Arthritis   . Lung cancer   . Chronic bronchitis   . Heart fibrillation   . Hypertension   . High cholesterol   . Kidney disease   . Thyroid disease   . UTI (urinary tract infection)   . 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 Type II or unspecified type diabetes mellitus without mention of complication, uncontrolled 05/16/2012    Past Surgical History  Procedure Laterality Date  . Joint replacement    . Knee surgery      Family History  Problem Relation Age of Onset  . Alcohol abuse      parent  . Arthritis      parent  . Lung cancer    . High Cholesterol    . Heart disease    . Hypertension    . Emotional abuse    . Diabetes      Social History:  reports that she has never smoked. She does not have any smokeless tobacco history on file. She reports that she does not drink alcohol or use illicit drugs.  Allergies:  Allergies  Allergen Reactions  . Ciprofloxacin Hcl     unknown  . Codeine     unknown  . Sulfa Antibiotics     unknown    Medications:  Scheduled: . [START ON  12/19/2012] diltiazem  120 mg Oral Daily  . insulin aspart  0-9 Units Subcutaneous TID WC  . pantoprazole (PROTONIX) IV  40 mg Intravenous Q12H  . QUEtiapine  50 mg Oral QHS  . sodium chloride  3 mL Intravenous Q12H   Continuous: . sodium chloride      Results for orders placed during the hospital encounter of 12/18/12 (from the past 24 hour(s))  CBC WITH DIFFERENTIAL     Status: Abnormal   Collection Time    12/18/12  2:19 PM      Result Value Range   WBC 12.6 (*) 4.0 - 10.5 K/uL   RBC 2.08 (*) 3.87 - 5.11 MIL/uL   Hemoglobin 6.4 (*) 12.0 - 15.0 g/dL   HCT 18.9 (*) 36.0 - 46.0 %   MCV 90.9  78.0 - 100.0 fL   MCH 30.8  26.0 - 34.0 pg   MCHC 33.9  30.0 - 36.0 g/dL   RDW 16.0 (*) 11.5 - 15.5 %   Platelets 176  150 - 400 K/uL   Neutrophils Relative % 82 (*) 43 - 77 %   Neutro Abs 10.4 (*)   1.7 - 7.7 K/uL   Lymphocytes Relative 15  12 - 46 %   Lymphs Abs 1.9  0.7 - 4.0 K/uL   Monocytes Relative 2 (*) 3 - 12 %   Monocytes Absolute 0.3  0.1 - 1.0 K/uL   Eosinophils Relative 1  0 - 5 %   Eosinophils Absolute 0.1  0.0 - 0.7 K/uL   Basophils Relative 0  0 - 1 %   Basophils Absolute 0.0  0.0 - 0.1 K/uL  COMPREHENSIVE METABOLIC PANEL     Status: Abnormal   Collection Time    12/18/12  2:19 PM      Result Value Range   Sodium 137  135 - 145 mEq/L   Potassium 4.0  3.5 - 5.1 mEq/L   Chloride 105  96 - 112 mEq/L   CO2 20  19 - 32 mEq/L   Glucose, Bld 202 (*) 70 - 99 mg/dL   BUN 71 (*) 6 - 23 mg/dL   Creatinine, Ser 1.49 (*) 0.50 - 1.10 mg/dL   Calcium 8.7  8.4 - 10.5 mg/dL   Total Protein 5.6 (*) 6.0 - 8.3 g/dL   Albumin 3.1 (*) 3.5 - 5.2 g/dL   AST 17  0 - 37 U/L   ALT 13  0 - 35 U/L   Alkaline Phosphatase 28 (*) 39 - 117 U/L   Total Bilirubin 0.1 (*) 0.3 - 1.2 mg/dL   GFR calc non Af Amer 30 (*) >90 mL/min   GFR calc Af Amer 35 (*) >90 mL/min  PRO B NATRIURETIC PEPTIDE     Status: None   Collection Time    12/18/12  2:19 PM      Result Value Range   Pro B Natriuretic  peptide (BNP) 165.6  0 - 450 pg/mL  PROTIME-INR     Status: None   Collection Time    12/18/12  2:19 PM      Result Value Range   Prothrombin Time 14.3  11.6 - 15.2 seconds   INR 1.13  0.00 - 1.49  APTT     Status: None   Collection Time    12/18/12  2:19 PM      Result Value Range   aPTT 28  24 - 37 seconds  OCCULT BLOOD, POC DEVICE     Status: Abnormal   Collection Time    12/18/12  3:16 PM      Result Value Range   Fecal Occult Bld POSITIVE (*) NEGATIVE  POCT I-STAT TROPONIN I     Status: None   Collection Time    12/18/12  3:19 PM      Result Value Range   Troponin i, poc 0.00  0.00 - 0.08 ng/mL   Comment 3           TYPE AND SCREEN     Status: None   Collection Time    12/18/12  3:25 PM      Result Value Range   ABO/RH(D) O NEG     Antibody Screen POS     Sample Expiration 12/21/2012     Antibody Identification PENDING     DAT, IgG PENDING    PREPARE RBC (CROSSMATCH)     Status: None   Collection Time    12/18/12  3:25 PM      Result Value Range   Order Confirmation ORDER PROCESSED BY BLOOD BANK    URINALYSIS, ROUTINE W REFLEX MICROSCOPIC     Status: Abnormal     Collection Time    12/18/12  3:32 PM      Result Value Range   Color, Urine YELLOW  YELLOW   APPearance CLOUDY (*) CLEAR   Specific Gravity, Urine 1.018  1.005 - 1.030   pH 5.0  5.0 - 8.0   Glucose, UA NEGATIVE  NEGATIVE mg/dL   Hgb urine dipstick LARGE (*) NEGATIVE   Bilirubin Urine NEGATIVE  NEGATIVE   Ketones, ur NEGATIVE  NEGATIVE mg/dL   Protein, ur NEGATIVE  NEGATIVE mg/dL   Urobilinogen, UA 0.2  0.0 - 1.0 mg/dL   Nitrite NEGATIVE  NEGATIVE   Leukocytes, UA LARGE (*) NEGATIVE  URINE MICROSCOPIC-ADD ON     Status: Abnormal   Collection Time    12/18/12  3:32 PM      Result Value Range   Squamous Epithelial / LPF RARE  RARE   WBC, UA 11-20  <3 WBC/hpf   RBC / HPF 3-6  <3 RBC/hpf   Bacteria, UA FEW (*) RARE     Dg Chest 2 View  12/18/2012   CLINICAL DATA:  Chest pain and shortness of  breath. Cough and congestion. History of lung cancer, hypertension, and diabetes.  EXAM: CHEST  2 VIEW  COMPARISON:  None.  FINDINGS: Mild wedging of a mid thoracic vertebral body. Indeterminate acuity.  Degraded portable AP view. Patient rotated to the right. Cardiomegaly. Tortuous thoracic aorta. No pleural effusion or pneumothorax. No congestive failure. Mild volume loss at the left lung base. Aortic atherosclerosis.  IMPRESSION: Cardiomegaly and low lung volumes, without acute disease.  Mid thoracic compression deformity, mild.   Electronically Signed   By: Kyle  Talbot M.D.   On: 12/18/2012 13:56    ROS:  As stated above in the HPI otherwise negative.  Blood pressure 118/86, pulse 77, resp. rate 19, SpO2 100.00%.    PE: Gen: NAD, Alert and Oriented HEENT:  [place]/AT, EOMI Neck: Supple, no LAD Lungs: CTA Bilaterally CV: RRR without M/G/R ABM: Soft, epigastric pain, +BS Ext: No C/C/E  Assessment/Plan: 1) Melena. 2) Anemia.  3) Heme positive stool.   4) Epigastric pain.    It appears that the patient has an upper GI bleed.  Further evaluation with an EGD is required.  She does have epigastric abdominal pain with the physical examination.  Plan: 1) EGD tomorrow. 2) Follow HGB. 3) Transfusion as necessary. 4) Continue with PPI.  Manson Luckadoo D 12/18/2012, 4:50 PM      

## 2012-12-18 NOTE — ED Provider Notes (Addendum)
CSN: [id number]     Arrival date & time 12/18/12  1247 History   First MD Initiated Contact with Patient 12/18/12 1249     Chief Complaint  Patient presents with  . Shortness of Breath   (Consider location/radiation/quality/duration/timing/severity/associated sxs/prior Treatment) Patient is a 79 y.o. female presenting with shortness of breath and chest pain. The history is provided by the patient.  Shortness of Breath Severity:  Severe Onset quality:  Gradual Duration: has been ongoing for 6months but worse over the last 4 days. Timing:  Intermittent Progression:  Worsening Chronicity:  New Context comment:  Severe with any exertion Relieved by:  Rest Worsened by:  Activity and exertion Ineffective treatments:  None tried Associated symptoms: chest pain   Associated symptoms: no abdominal pain, no cough, no sputum production, no vomiting and no wheezing   Chest Pain Pain location:  Substernal area Pain quality: tightness   Pain radiates to:  Does not radiate Pain radiates to the back: no   Pain severity:  Moderate Onset quality:  Gradual Duration:  15 minutes Timing:  Constant Progression:  Resolved Chronicity:  New Context comment:  Started after going to the bathroom today and straining Relieved by:  Rest Worsened by:  Nothing tried Ineffective treatments:  None tried Associated symptoms: shortness of breath   Associated symptoms: no abdominal pain, no cough, no nausea, no near-syncope, no palpitations, not vomiting and no weakness   Risk factors: diabetes mellitus and hypertension     Past Medical History  Diagnosis Date  . Diabetes mellitus without complication   . Hypertension   . Atrial fibrillation   . GERD (gastroesophageal reflux disease)   . 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 Arthritis   . Lung cancer   . Chronic bronchitis   . Heart fibrillation   . Hypertension   . High cholesterol   . Kidney disease   . Thyroid disease   . UTI  (urinary tract infection)   . 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 Type II or unspecified type diabetes mellitus without mention of complication, uncontrolled 05/16/2012   Past Surgical History  Procedure Laterality Date  . Joint replacement    . Knee surgery     Family History  Problem Relation Age of Onset  . Alcohol abuse      parent  . Arthritis      parent  . Lung cancer    . High Cholesterol    . Heart disease    . Hypertension    . Emotional abuse    . Diabetes     History  Substance Use Topics  . Smoking status: Never Smoker   . Smokeless tobacco: Not on file  . Alcohol Use: No   OB History   Grav Para Term Preterm Abortions TAB SAB Ect Mult Living                 Review of Systems  Respiratory: Positive for shortness of breath. Negative for cough, sputum production and wheezing.   Cardiovascular: Positive for chest pain. Negative for palpitations and near-syncope.  Gastrointestinal: Negative for nausea, vomiting and abdominal pain.       Black stools for last 5 days but home nurse checked her this week and told her it was neg  Neurological: Negative for weakness.  All other systems reviewed and are negative.    Allergies  Ciprofloxacin hcl; Codeine; and Sulfa antibiotics  Home Medications   Current Outpatient Rx  Name  Route  Sig  Dispense  Refill  . amoxicillin-clavulanate (AUGMENTIN) 500-125 MG per tablet   Oral   Take 1 tablet by mouth 3 (three) times daily. Take for 10 days, starting 12/16/12         . atorvastatin (LIPITOR) 10 MG tablet      TAKE 1 TABLET AT BEDTIME FOR CHOLESTEROL   90 tablet   2   . diltiazem (CARDIZEM CD) 120 MG 24 hr capsule      TAKE ONE CAPSULE BY MOUTH EVERY DAY   90 capsule   3   . glipiZIDE (GLUCOTROL XL) 5 MG 24 hr tablet      TAKE 1 TABLET EVERY DAY   90 tablet   0   . guaiFENesin (MUCINEX) 600 MG 12 hr tablet   Oral   Take 600 mg by mouth 2 (two) times daily.         Marland Kitchen levothyroxine (SYNTHROID,  LEVOTHROID) 75 MCG tablet   Oral   Take 75 mcg by mouth daily.         Marland Kitchen lisinopril (PRINIVIL,ZESTRIL) 40 MG tablet   Oral   Take 40 mg by mouth daily.         Marland Kitchen loratadine (CLARITIN) 10 MG tablet   Oral   Take 10 mg by mouth daily.         . nitrofurantoin, macrocrystal-monohydrate, (MACROBID) 100 MG capsule   Oral   Take 1 capsule (100 mg total) by mouth 3 (three) times daily.   21 capsule   0   . omeprazole (PRILOSEC) 40 MG capsule   Oral   Take 40 mg by mouth daily.         . QUEtiapine (SEROQUEL) 50 MG tablet   Oral   Take 1 tablet (50 mg total) by mouth at bedtime.   90 tablet   3    BP 115/40  Pulse 70  Resp 15  SpO2 100% Physical Exam  Nursing note and vitals reviewed. Constitutional: She is oriented to person, place, and time. She appears well-developed and well-nourished. No distress.  HENT:  Head: Normocephalic and atraumatic.  Mouth/Throat: Oropharynx is clear and moist.  Eyes: EOM are normal. Pupils are equal, round, and reactive to light.  Pale conjunctiva  Neck: Normal range of motion. Neck supple.  Cardiovascular: Normal rate and intact distal pulses.  An irregularly irregular rhythm present.  No murmur heard. Pulmonary/Chest: Effort normal and breath sounds normal. No respiratory distress. She has no wheezes. She has no rales.  Abdominal: Soft. She exhibits distension. There is no tenderness. There is no rebound and no guarding.  Musculoskeletal: Normal range of motion. She exhibits no edema and no tenderness.  Neurological: She is alert and oriented to person, place, and time.  Skin: Skin is warm and dry. No rash noted. No erythema. There is pallor.  Psychiatric: She has a normal mood and affect. Her behavior is normal.    ED Course  Procedures (including critical care time) Labs Review Labs Reviewed  CBC WITH DIFFERENTIAL - Abnormal; Notable for the following:    WBC 12.6 (*)    RBC 2.08 (*)    Hemoglobin 6.4 (*)    HCT 18.9 (*)     RDW 16.0 (*)    Neutrophils Relative % 82 (*)    Neutro Abs 10.4 (*)    Monocytes Relative 2 (*)    All other components within normal limits  COMPREHENSIVE METABOLIC PANEL - Abnormal; Notable for  the following:    Glucose, Bld 202 (*)    BUN 71 (*)    Creatinine, Ser 1.49 (*)    Total Protein 5.6 (*)    Albumin 3.1 (*)    Alkaline Phosphatase 28 (*)    Total Bilirubin 0.1 (*)    GFR calc non Af Amer 30 (*)    GFR calc Af Amer 35 (*)    All other components within normal limits  PRO B NATRIURETIC PEPTIDE  PROTIME-INR  APTT  URINALYSIS, ROUTINE W REFLEX MICROSCOPIC  OCCULT BLOOD X 1 CARD TO LAB, STOOL  TYPE AND SCREEN  PREPARE RBC (CROSSMATCH)   Imaging Review Dg Chest 2 View  12/18/2012   CLINICAL DATA:  Chest pain and shortness of breath. Cough and congestion. History of lung cancer, hypertension, and diabetes.  EXAM: CHEST  2 VIEW  COMPARISON:  None.  FINDINGS: Mild wedging of a mid thoracic vertebral body. Indeterminate acuity.  Degraded portable AP view. Patient rotated to the right. Cardiomegaly. Tortuous thoracic aorta. No pleural effusion or pneumothorax. No congestive failure. Mild volume loss at the left lung base. Aortic atherosclerosis.  IMPRESSION: Cardiomegaly and low lung volumes, without acute disease.  Mid thoracic compression deformity, mild.   Electronically Signed   By: Jeronimo Greaves M.D.   On: 12/18/2012 13:56    EKG Interpretation     Ventricular Rate:  72 PR Interval:  154 QRS Duration: 86 QT Interval:  391 QTC Calculation: 428 R Axis:   -10 Text Interpretation:  Sinus rhythm Low voltage, precordial leads Abnormal R-wave progression, early transition Borderline T wave abnormalities Baseline wander in lead(s) I aVR No previous tracing            MDM   1. Anemia   2. Demand ischemia   3. GI bleeding     Patient presents today with worsening exertional dyspnea over the last 4 days and then an episode of chest pain today after she used the  bathroom that lasted between 10 and 15 minutes that resolved with rest.  Patient is currently chest pain-free and shortness of breath is minimal at rest. Patient states she used to wear oxygen but has not used it in over a year. She does not smoke and has no history of obstructive pulmonary disease. On exam chest pain is not reproducible only trace edema in the lower extremities and no overt signs of fluid overload. Symptoms are concerning for possible anginal equivalent vs anemia as pt is pale on exam and does report having dark stools.  Chest x-ray without acute findings.  EKG unrevealing.   CBC, CMP, BMP, UA, PT/INR pending.  2:52 PM Hb 6.5 which would explain pt's sx due to anemia.  Pt will be given blood.  Hemoccult card pending.    Gwyneth Sprout, MD 12/18/12 1512  Gwyneth Sprout, MD 12/18/12 1513

## 2012-12-18 NOTE — ED Notes (Signed)
MD at bedside. 

## 2012-12-18 NOTE — H&P (Addendum)
[facility] History and Physical  [name] [name] ZOX:[id number] DOB: [birth year] DOA: 12/18/2012  Referring physician: Gwyneth Sprout, MD PCP: Florentina Jenny, MD  Specialists:   Chief Complaint: Shortness of breath  HPI: [name] [name] is a 79 y.o. female with past medical history diabetes mellitus, hypertension and atrial fibrillation. Patient not in any type of anticoagulation. Patient came in to the hospital because of shortness of breath. Patient was in her usual state of health until about 5 days ago and she started to have dyspnea with exertion, dyspnea worsened. About the same time she noticed black stools, she reported that her primary care physician and she reported a nurse came and checked on her stool and it was guaiac-negative. She denies any abdominal pain, denies any vomiting. Patient did have SOB, palpitations and fatigue with exertion. She also did report some chest heaviness along with the shortness of breath. In the ED patient hemoglobin is 6.4, she has hemoglobin of 13.0 in January of 2014. Patient admitted to the hospital for further evaluation the  Review of Systems: Constitutional: Easy fatigability Eyes: negative for irritation, redness and visual disturbance Ears, nose, mouth, throat, and face: negative for earaches, epistaxis, nasal congestion and sore throat Respiratory: negative for cough, dyspnea on exertion, sputum and wheezing Cardiovascular: Dyspnea with exertion Gastrointestinal: negative for abdominal pain, constipation, diarrhea, melena, nausea and vomiting Genitourinary:negative for dysuria, frequency and hematuria Hematologic/lymphatic: negative for bleeding, easy bruising and lymphadenopathy Musculoskeletal:negative for arthralgias, muscle weakness and stiff joints Neurological: negative for coordination problems, gait problems, headaches and weakness Endocrine: negative for diabetic symptoms including polydipsia, polyuria and weight  loss Allergic/Immunologic: negative for anaphylaxis, hay fever and urticaria  Past Medical History  Diagnosis Date  . Diabetes mellitus without complication   . Hypertension   . Atrial fibrillation   . GERD (gastroesophageal reflux disease)   . 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 Arthritis   . Lung cancer   . Chronic bronchitis   . Heart fibrillation   . Hypertension   . High cholesterol   . Kidney disease   . Thyroid disease   . UTI (urinary tract infection)   . 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 Type II or unspecified type diabetes mellitus without mention of complication, uncontrolled 05/16/2012   Past Surgical History  Procedure Laterality Date  . Joint replacement    . Knee surgery     Social History:  reports that she has never smoked. She does not have any smokeless tobacco history on file. She reports that she does not drink alcohol or use illicit drugs.  Allergies  Allergen Reactions  . Ciprofloxacin Hcl     unknown  . Codeine     unknown  . Sulfa Antibiotics     unknown    Family History  Problem Relation Age of Onset  . Alcohol abuse      parent  . Arthritis      parent  . Lung cancer    . High Cholesterol    . Heart disease    . Hypertension    . Emotional abuse    . Diabetes     Prior to Admission medications   Medication Sig Start Date End Date Taking? Authorizing Provider  amoxicillin-clavulanate (AUGMENTIN) 500-125 MG per tablet Take 1 tablet by mouth 3 (three) times daily. Take for 10 days, starting 12/16/12   Yes Historical Provider, MD  atorvastatin (LIPITOR) 10 MG tablet TAKE 1 TABLET AT BEDTIME FOR CHOLESTEROL 10/14/12  Yes Tiffany L Reed, DO  diltiazem (CARDIZEM CD) 120 MG 24 hr capsule TAKE ONE CAPSULE BY MOUTH EVERY DAY 06/12/12  Yes Claudie Revering, NP  glipiZIDE (GLUCOTROL XL) 5 MG 24 hr tablet TAKE 1 TABLET EVERY DAY 09/15/12  Yes Terressa Koyanagi, DO  guaiFENesin (MUCINEX) 600 MG 12 hr tablet Take 600 mg by mouth 2 (two)  times daily.   Yes Historical Provider, MD  levothyroxine (SYNTHROID, LEVOTHROID) 75 MCG tablet Take 75 mcg by mouth daily.   Yes Historical Provider, MD  lisinopril (PRINIVIL,ZESTRIL) 40 MG tablet Take 40 mg by mouth daily.   Yes Historical Provider, MD  loratadine (CLARITIN) 10 MG tablet Take 10 mg by mouth daily.   Yes Historical Provider, MD  nitrofurantoin, macrocrystal-monohydrate, (MACROBID) 100 MG capsule Take 1 capsule (100 mg total) by mouth 3 (three) times daily. 05/17/12  Yes Terressa Koyanagi, DO  omeprazole (PRILOSEC) 40 MG capsule Take 40 mg by mouth daily.   Yes Historical Provider, MD  QUEtiapine (SEROQUEL) 50 MG tablet Take 1 tablet (50 mg total) by mouth at bedtime. 10/12/12  Yes Claudie Revering, NP   Physical Exam: Filed Vitals:   12/18/12 1600  BP: 118/86  Pulse: 77  Resp: 19   General appearance: alert, cooperative and no distress  Head: Normocephalic, without obvious abnormality, atraumatic  Eyes: conjunctivae/corneas clear. PERRL, EOM's intact. Fundi benign.  Nose: Nares normal. Septum midline. Mucosa normal. No drainage or sinus tenderness.  Throat: lips, mucosa, and tongue normal; teeth and gums normal  Neck: Supple, no masses, no cervical lymphadenopathy, no JVD appreciated, no meningeal signs Resp: clear to auscultation bilaterally  Chest wall: no tenderness  Cardio: regular rate and rhythm, S1, S2 normal, no murmur, click, rub or gallop  GI: soft, non-tender; bowel sounds normal; no masses, no organomegaly  Extremities: extremities normal, atraumatic, no cyanosis or edema  Skin: Skin color, texture, turgor normal. No rashes or lesions  Neurologic: Alert and oriented X 3, normal strength and tone. Normal symmetric reflexes. Normal coordination and gait  Labs on Admission:  Basic Metabolic Panel:  Recent Labs Lab 12/18/12 1419  NA 137  K 4.0  CL 105  CO2 20  GLUCOSE 202*  BUN 71*  CREATININE 1.49*  CALCIUM 8.7   Liver Function Tests:  Recent  Labs Lab 12/18/12 1419  AST 17  ALT 13  ALKPHOS 28*  BILITOT 0.1*  PROT 5.6*  ALBUMIN 3.1*   No results found for this basename: LIPASE, AMYLASE,  in the last 168 hours No results found for this basename: AMMONIA,  in the last 168 hours CBC:  Recent Labs Lab 12/18/12 1419  WBC 12.6*  NEUTROABS 10.4*  HGB 6.4*  HCT 18.9*  MCV 90.9  PLT 176   Cardiac Enzymes: No results found for this basename: CKTOTAL, CKMB, CKMBINDEX, TROPONINI,  in the last 168 hours  BNP (last 3 results)  Recent Labs  12/18/12 1419  PROBNP 165.6   CBG: No results found for this basename: GLUCAP,  in the last 168 hours  Radiological Exams on Admission: Dg Chest 2 View  12/18/2012   CLINICAL DATA:  Chest pain and shortness of breath. Cough and congestion. History of lung cancer, hypertension, and diabetes.  EXAM: CHEST  2 VIEW  COMPARISON:  None.  FINDINGS: Mild wedging of a mid thoracic vertebral body. Indeterminate acuity.  Degraded portable AP view. Patient rotated to the right. Cardiomegaly. Tortuous thoracic aorta. No pleural effusion or pneumothorax. No congestive failure. Mild  volume loss at the left lung base. Aortic atherosclerosis.  IMPRESSION: Cardiomegaly and low lung volumes, without acute disease.  Mid thoracic compression deformity, mild.   Electronically Signed   By: Jeronimo Greaves M.D.   On: 12/18/2012 13:56    EKG: Independently reviewed.   Assessment/Plan Principal Problem:   Anemia Active Problems:   Type II or unspecified type diabetes mellitus without mention of complication, uncontrolled   Lung cancer - followed by Dr. Mardi Mainland at [facility]   Essential hypertension, benign   Panhypopituitarism   Melena   CKD (chronic kidney disease), stage III   Anemia -Likely is acute blood loss anemia secondary to GI bleeding. -The absence of microcytosis suggest acuity rather than chronicity. -Reported melanotic stools for the past 5 days. -Transfuse 2 units of packed RBCs, monitor  hemoglobin. -Keep hemoglobin more than 8.  Melanotic stools -Probable GI bleed, started on twice a day IV Protonix. -On aspirin. Denies any NSAIDs use, uses Aleve every once in a while. -Gastroenterology to evaluate. No active bleeding.  Type 2 diabetes mellitus -Currently n.p.o., hold home medications. -When allowed to eat carbohydrate modified diet and insulin sliding scale. Check A1c.   Atrial fibrillation -Reported history of atrial fibrillation, currently normal sinus rhythm. -Rate is controlled with Cardizem, on aspirin.   CKD -Hemoglobin baseline around 1.4, she is around her baseline now.  Panhypopituitarism -Reported on previous note that she is on hydrocortisone 20 mg a.m. and 10 mg in the evening. -She is on 75 mcg of Synthroid. -I will ask pharmacy to check on medications.  History of lung cancer -Status post radiation.   UTI -Urinalysis is showing questionable UTI, treated empirically with Rocephin and adjust antibiotics when culture back.  Code Status: Full code Family Communication: I discussed with the patient in the presence of her son at bedside. Disposition Plan: Inpatient, telemetry, anticipate length of stay to be greater than 2 midnights.  Time spent: 70 minutes  [facility] A [facility] Pager [phone]  If 7PM-7AM, please contact night-coverage [email or web address] Password [email or web address] 12/18/2012, 4:30 PM

## 2012-12-19 ENCOUNTER — Encounter (HOSPITAL_COMMUNITY)
Admission: EM | Disposition: A | Discharge status: Home / Self Care | Payer: Self-pay | Source: Home / Self Care | Attending: Internal Medicine

## 2012-12-19 ENCOUNTER — Inpatient Hospital Stay (HOSPITAL_COMMUNITY): Payer: Medicare Other

## 2012-12-19 ENCOUNTER — Encounter (HOSPITAL_COMMUNITY)
Admission: EM | Disposition: A | Discharge status: Home / Self Care | Payer: Medicare Other | Source: Home / Self Care | Attending: Internal Medicine

## 2012-12-19 ENCOUNTER — Encounter (HOSPITAL_COMMUNITY): Payer: Self-pay

## 2012-12-19 DIAGNOSIS — G2581 Restless legs syndrome: Secondary | ICD-10-CM

## 2012-12-19 DIAGNOSIS — K264 Chronic or unspecified duodenal ulcer with hemorrhage: Secondary | ICD-10-CM

## 2012-12-19 HISTORY — PX: ESOPHAGOGASTRODUODENOSCOPY: SHX5428

## 2012-12-19 LAB — GLUCOSE, CAPILLARY
Glucose-Capillary: 80 mg/dL (ref 70–99)
Glucose-Capillary: 139 mg/dL — ABNORMAL HIGH (ref 70–99)
Glucose-Capillary: 173 mg/dL — ABNORMAL HIGH (ref 70–99)
Glucose-Capillary: 102 mg/dL — ABNORMAL HIGH (ref 70–99)

## 2012-12-19 LAB — URINE CULTURE

## 2012-12-19 LAB — BASIC METABOLIC PANEL
Calcium: 8.8 mg/dL (ref 8.4–10.5)
GFR calc Af Amer: 37 mL/min — ABNORMAL LOW (ref >90)
GFR calc non Af Amer: 32 mL/min — ABNORMAL LOW (ref >90)
Glucose, Bld: 67 mg/dL — ABNORMAL LOW (ref 70–99)
Potassium: 4.6 mEq/L (ref 3.5–5.1)
Sodium: 142 mEq/L (ref 135–145)

## 2012-12-19 LAB — CBC
HCT: 27.1 % — ABNORMAL LOW (ref 36.0–46.0)
HCT: 27.5 % — ABNORMAL LOW (ref 36.0–46.0)
Hemoglobin: 9.3 g/dL — ABNORMAL LOW (ref 12.0–15.0)
Hemoglobin: 9.5 g/dL — ABNORMAL LOW (ref 12.0–15.0)
MCH: 30.5 pg (ref 26.0–34.0)
MCV: 86.5 fL (ref 78.0–100.0)
MCV: 87.6 fL (ref 78.0–100.0)
Platelets: 155 10*3/uL (ref 150–400)
Platelets: 169 10*3/uL (ref 150–400)
RBC: 3.04 MIL/uL — ABNORMAL LOW (ref 3.87–5.11)
RBC: 3.11 MIL/uL — ABNORMAL LOW (ref 3.87–5.11)
RBC: 3.14 MIL/uL — ABNORMAL LOW (ref 3.87–5.11)
WBC: 9.3 10*3/uL (ref 4.0–10.5)
WBC: 9.5 10*3/uL (ref 4.0–10.5)
WBC: 9.6 10*3/uL (ref 4.0–10.5)

## 2012-12-19 LAB — HEMOGLOBIN A1C
Hgb A1c MFr Bld: 6.8 % — ABNORMAL HIGH (ref <5.7)
Mean Plasma Glucose: 148 mg/dL — ABNORMAL HIGH (ref <117)

## 2012-12-19 LAB — TROPONIN I: Troponin I: <0.3 ng/mL (ref <0.30)

## 2012-12-19 SURGERY — EGD (ESOPHAGOGASTRODUODENOSCOPY)
Anesthesia: Moderate Sedation

## 2012-12-19 MED ORDER — FENTANYL CITRATE 0.05 MG/ML IJ SOLN
INTRAMUSCULAR | Status: AC
  Filled 2012-12-19: qty 2

## 2012-12-19 MED ORDER — SODIUM CHLORIDE 0.9 % IJ SOLN
PREFILLED_SYRINGE | INTRAMUSCULAR | Status: DC | PRN
  Administered 2012-12-19: 11:00:00

## 2012-12-19 MED ORDER — MIDAZOLAM HCL 5 MG/ML IJ SOLN
INTRAMUSCULAR | Status: AC
  Filled 2012-12-19: qty 1

## 2012-12-19 MED ORDER — DEXTROSE-NACL 5-0.45 % IV SOLN
INTRAVENOUS | Status: DC
  Administered 2012-12-19 – 2012-12-20 (×2): via INTRAVENOUS
  Filled 2012-12-19 (×2): qty 1000

## 2012-12-19 MED ORDER — METHOCARBAMOL 750 MG PO TABS
750.0000 mg | ORAL_TABLET | Freq: Three times a day (TID) | ORAL | Status: DC | PRN
  Administered 2012-12-19: 750 mg via ORAL
  Filled 2012-12-19: qty 1

## 2012-12-19 MED ORDER — FENTANYL CITRATE 0.05 MG/ML IJ SOLN
INTRAMUSCULAR | Status: DC | PRN
  Administered 2012-12-19 (×2): 25 ug via INTRAVENOUS

## 2012-12-19 MED ORDER — EPINEPHRINE HCL 0.1 MG/ML IJ SOSY
PREFILLED_SYRINGE | INTRAMUSCULAR | Status: AC
  Filled 2012-12-19: qty 10

## 2012-12-19 MED ORDER — FENTANYL CITRATE 0.05 MG/ML IJ SOLN
50.0000 ug | Freq: Once | INTRAMUSCULAR | Status: AC
  Administered 2012-12-19: 50 ug via INTRAVENOUS

## 2012-12-19 MED ORDER — FENTANYL CITRATE 0.05 MG/ML IJ SOLN
25.0000 ug | Freq: Once | INTRAMUSCULAR | Status: AC
  Administered 2012-12-19: 25 ug via INTRAVENOUS

## 2012-12-19 MED ORDER — LISINOPRIL 40 MG PO TABS
40.0000 mg | ORAL_TABLET | Freq: Every day | ORAL | Status: DC
  Administered 2012-12-19 – 2012-12-20 (×2): 40 mg via ORAL
  Filled 2012-12-19 (×3): qty 1

## 2012-12-19 MED ORDER — MIDAZOLAM HCL 10 MG/2ML IJ SOLN
INTRAMUSCULAR | Status: DC | PRN
  Administered 2012-12-19 (×3): 1 mg via INTRAVENOUS

## 2012-12-19 MED ORDER — BUTAMBEN-TETRACAINE-BENZOCAINE 2-2-14 % EX AERO
INHALATION_SPRAY | CUTANEOUS | Status: DC | PRN
  Administered 2012-12-19: 2 via TOPICAL

## 2012-12-19 MED ORDER — HYDRALAZINE HCL 20 MG/ML IJ SOLN: 10.0000 mg | Freq: Four times a day (QID) | INTRAMUSCULAR | Status: DC | PRN

## 2012-12-19 NOTE — Progress Notes (Signed)
UR completed. Candela Krul RN CCM Case Mgmt phone [phone] 

## 2012-12-19 NOTE — Progress Notes (Addendum)
[facility] PROGRESS NOTE  [name] [name] EXB:[id number] DOB: [birth year] DOA: 12/18/2012 PCP: Florentina Jenny, MD  Assessment/Plan:  Duodenal Ulcer - Received EGD today 11/17. Was found to have actively bleeding duodenal ulcer.  -The absence of microcytosis suggest acuity rather than chronicity.  -Denies use of NSAID other than occasional Aleve  Acute Blood Loss Anemia -Secondary to upper GI bleed. -Reported melanotic stools for the past 5 days.  -Transfused 2 units of packed RBCs 11/16. Hbg increased to 9.3 today. -Was started on IV protonix 11/16. -Will continue IV PPI twice daily pending clinical course and consider repeat EGD as needed per Dr. Christella Hartigan, Gastroenterology.  Type 2 diabetes mellitus  -A1c is 6.8 -Carbohydrate modified diet and insulin sliding scale - sensitive.  Atrial fibrillation  -Reported history of atrial fibrillation, currently normal sinus rhythm.  -Rate is controlled with Cardizem, on aspirin.  -Actively bleeding, not on anticoagulation.   CKD  -Creatinine baseline around 1.4, she is around her baseline now.   Panhypopituitarism  -Reported on previous note that she is on hydrocortisone 20 mg a.m. and 10 mg in the evening.  -She is on 75 mcg of Synthroid. -Started on 20 mg of hydrocortisone PO daily per pharmacy.  History of lung cancer  -Status post radiation.   UTI  -Urinalysis is showing questionable UTI with leukocytes and bacteria -Patient reports urinary urgency, and increased frequency, and sharp pains before urination that are relieved with urination. -Treated empirically with Rocephin started 11/16 and adjust antibiotics when culture back.  Restless Legs -New complaint of restless legs began over last several weeks. -Will check serum iron and ferritin to identify possible source. -Added Robaxin q 8 PRN  Hypertension -Patient is off of her blood pressure medications including lisinopril and amlodipine. -Blood pressure elevated,  restart home medications, hydralazine as needed for SBP of more than 160.  Code Status: FULL  Disposition Plan: discharge home when stable   Consultants:  Gastroenterology  Procedures:  EGD - 11/17  Antibiotics:  Rocephin. Day 2. Started 11/16.  HPI/Subjective: Patient is feeling nervous this morning for her procedure. Reports that she has been having trouble sleeping for the past two nights due to restless legs. She has been having this problem for the past several weeks and has not told anyone about it. Has not had a bowel movement since yesterday. Has not had any blood per rectum. Still has mild epigastric pain. States that when she gets up to go to bedside commode she gets SOB. Feels fine when lying flat. States she is having urinary urgency and pain in bladder that is relieved with urination. This has gone on for about 1 week.   Objective: Filed Vitals:   12/19/12 1120  BP: 215/151  Pulse: 69  Temp:   Resp: 16    Intake/Output Summary (Last 24 hours) at 12/19/12 1201 Last data filed at 12/19/12 0045  Gross per 24 hour  Intake    600 ml  Output    450 ml  Net    150 ml   Filed Weights   12/18/12 1652  Weight: 79.924 kg (176 lb 3.2 oz)    Exam:   General:  Patient is alert and oriented x3. Patient is nervous about procedure.  Cardiovascular: RRR, no murmur, gallop or rub.  Respiratory: CTAB, no rhonchi, rales, crackles  Abdomen: +BS, tenderness to light palpation of epigastrium and tenderness to deep palpation of RLQ and RUQ. Non distended, no ascites. Suprapubic pain present on deep palpation.  Musculoskeletal: no  edema noted. Full ROM in all four.  Data Reviewed: Basic Metabolic Panel:  Recent Labs Lab 12/18/12 1419 12/19/12 0640  NA 137 142  K 4.0 4.6  CL 105 110  CO2 20 23  GLUCOSE 202* 67*  BUN 71* 56*  CREATININE 1.49* 1.42*  CALCIUM 8.7 8.8   Liver Function Tests:  Recent Labs Lab 12/18/12 1419  AST 17  ALT 13  ALKPHOS 28*   BILITOT 0.1*  PROT 5.6*  ALBUMIN 3.1*    CBC:  Recent Labs Lab 12/18/12 1419 12/19/12 0640  WBC 12.6* 9.3  NEUTROABS 10.4*  --   HGB 6.4* 9.3*  HCT 18.9* 26.3*  MCV 90.9 86.5  PLT 176 155   Cardiac Enzymes:  Recent Labs Lab 12/18/12 1700 12/18/12 2221 12/19/12 0640  TROPONINI <0.30 <0.30 <0.30   BNP (last 3 results)  Recent Labs  12/18/12 1419  PROBNP 165.6   CBG:  Recent Labs Lab 12/18/12 1708 12/18/12 2139 12/19/12 0752 12/19/12 1024  GLUCAP 199* 168* 70 80       Studies: Dg Chest 2 View  12/18/2012   CLINICAL DATA:  Chest pain and shortness of breath. Cough and congestion. History of lung cancer, hypertension, and diabetes.  EXAM: CHEST  2 VIEW  COMPARISON:  None.  FINDINGS: Mild wedging of a mid thoracic vertebral body. Indeterminate acuity.  Degraded portable AP view. Patient rotated to the right. Cardiomegaly. Tortuous thoracic aorta. No pleural effusion or pneumothorax. No congestive failure. Mild volume loss at the left lung base. Aortic atherosclerosis.  IMPRESSION: Cardiomegaly and low lung volumes, without acute disease.  Mid thoracic compression deformity, mild.   Electronically Signed   By: Jeronimo Greaves M.D.   On: 12/18/2012 13:56    Scheduled Meds: . [MAR HOLD] cefTRIAXone (ROCEPHIN)  IV  1 g Intravenous Q24H  . [facility] HOLD] diltiazem  120 mg Oral Daily  . fentaNYL  50 mcg Intravenous Once  . [MAR HOLD] hydrocortisone  10 mg Oral QPC lunch  . [facility] HOLD] hydrocortisone  20 mg Oral Daily  . [MAR HOLD] insulin aspart  0-9 Units Subcutaneous TID WC  . [MAR HOLD] pantoprazole (PROTONIX) IV  40 mg Intravenous Q12H  . [MAR HOLD] QUEtiapine  50 mg Oral QHS  . [facility] HOLD] sodium chloride  3 mL Intravenous Q12H   Continuous Infusions: . dextrose 5 % and 0.45% NaCl 1,000 mL infusion 50 mL/hr at 12/19/12 1610    Principal Problem:   Duodenal ulcer with hemorrhage Active Problems:   Type II or unspecified type diabetes mellitus without mention of  complication, uncontrolled   Lung cancer - followed by Dr. Mardi Mainland at [facility]   Essential hypertension, benign   Panhypopituitarism   Anemia   Melena   CKD (chronic kidney disease), stage III   Restless legs        Georgina Quint, PA-S [facility]  Algis Downs, [place] [facility] Pager [phone]. If 7PM-7AM, please contact night-coverage at [email or web address], password [facility] 12/19/2012, 12:01 PM  LOS: 1 day         Addendum  Patient seen and examined, chart and data base reviewed.  I agree with the above assessment and plan.  For full details please see Mrs. Algis Downs PA note.   Clint Lipps, MD [facility] Pager: [phone] 12/19/2012, 1:01 PM

## 2012-12-19 NOTE — H&P (View-Only) (Signed)
Consult for [name] [name]  Reason for Consult: [name] bleed Referring Physician: [facility]  Alycia Patten HPI: This is an 79 year old female with a PMH of DM, HTN, and atrial fibrillation admitted for melena and an HGB of 6.4.  In January her HGB was noted to be at 13.0.  She started to have issues with DOE 5 days ago and it was associated with melena.  No prior history of [name] bleed.  On occassions she will use hydrocodone and Aleve for pain symptoms if she is going to exert herself.  There is a history of lung cancer, but I do not know the type of cancer.  Upon admission she did have issues with chest heaviness that lasted for 15 minutes, but it resolved spontaneously.    Past Medical History  Diagnosis Date  . Diabetes mellitus without complication   . Hypertension   . Atrial fibrillation   . GERD (gastroesophageal reflux disease)   . 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 Arthritis   . Lung cancer   . Chronic bronchitis   . Heart fibrillation   . Hypertension   . High cholesterol   . Kidney disease   . Thyroid disease   . UTI (urinary tract infection)   . 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 Type II or unspecified type diabetes mellitus without mention of complication, uncontrolled 05/16/2012    Past Surgical History  Procedure Laterality Date  . Joint replacement    . Knee surgery      Family History  Problem Relation Age of Onset  . Alcohol abuse      parent  . Arthritis      parent  . Lung cancer    . High Cholesterol    . Heart disease    . Hypertension    . Emotional abuse    . Diabetes      Social History:  reports that she has never smoked. She does not have any smokeless tobacco history on file. She reports that she does not drink alcohol or use illicit drugs.  Allergies:  Allergies  Allergen Reactions  . Ciprofloxacin Hcl     unknown  . Codeine     unknown  . Sulfa Antibiotics     unknown    Medications:  Scheduled: . [START ON  12/19/2012] diltiazem  120 mg Oral Daily  . insulin aspart  0-9 Units Subcutaneous TID WC  . pantoprazole (PROTONIX) IV  40 mg Intravenous Q12H  . QUEtiapine  50 mg Oral QHS  . sodium chloride  3 mL Intravenous Q12H   Continuous: . sodium chloride      Results for orders placed during the hospital encounter of 12/18/12 (from the past 24 hour(s))  CBC WITH DIFFERENTIAL     Status: Abnormal   Collection Time    12/18/12  2:19 PM      Result Value Range   WBC 12.6 (*) 4.0 - 10.5 K/uL   RBC 2.08 (*) 3.87 - 5.11 MIL/uL   Hemoglobin 6.4 (*) 12.0 - 15.0 g/dL   HCT 69.6 (*) 29.5 - 28.4 %   MCV 90.9  78.0 - 100.0 fL   MCH 30.8  26.0 - 34.0 pg   MCHC 33.9  30.0 - 36.0 g/dL   RDW 13.2 (*) 44.0 - 10.2 %   Platelets 176  150 - 400 K/uL   Neutrophils Relative % 82 (*) 43 - 77 %   Neutro Abs 10.4 (*)  1.7 - 7.7 K/uL   Lymphocytes Relative 15  12 - 46 %   Lymphs Abs 1.9  0.7 - 4.0 K/uL   Monocytes Relative 2 (*) 3 - 12 %   Monocytes Absolute 0.3  0.1 - 1.0 K/uL   Eosinophils Relative 1  0 - 5 %   Eosinophils Absolute 0.1  0.0 - 0.7 K/uL   Basophils Relative 0  0 - 1 %   Basophils Absolute 0.0  0.0 - 0.1 K/uL  COMPREHENSIVE METABOLIC PANEL     Status: Abnormal   Collection Time    12/18/12  2:19 PM      Result Value Range   Sodium 137  135 - 145 mEq/L   Potassium 4.0  3.5 - 5.1 mEq/L   Chloride 105  96 - 112 mEq/L   CO2 20  19 - 32 mEq/L   Glucose, Bld 202 (*) 70 - 99 mg/dL   BUN 71 (*) 6 - 23 mg/dL   Creatinine, Ser 4.09 (*) 0.50 - 1.10 mg/dL   Calcium 8.7  8.4 - 81.1 mg/dL   Total Protein 5.6 (*) 6.0 - 8.3 g/dL   Albumin 3.1 (*) 3.5 - 5.2 g/dL   AST 17  0 - 37 U/L   ALT 13  0 - 35 U/L   Alkaline Phosphatase 28 (*) 39 - 117 U/L   Total Bilirubin 0.1 (*) 0.3 - 1.2 mg/dL   GFR calc non Af Amer 30 (*) >90 mL/min   GFR calc Af Amer 35 (*) >90 mL/min  PRO B NATRIURETIC PEPTIDE     Status: None   Collection Time    12/18/12  2:19 PM      Result Value Range   Pro B Natriuretic  peptide (BNP) 165.6  0 - 450 pg/mL  PROTIME-INR     Status: None   Collection Time    12/18/12  2:19 PM      Result Value Range   Prothrombin Time 14.3  11.6 - 15.2 seconds   INR 1.13  0.00 - 1.49  APTT     Status: None   Collection Time    12/18/12  2:19 PM      Result Value Range   aPTT 28  24 - 37 seconds  OCCULT BLOOD, POC DEVICE     Status: Abnormal   Collection Time    12/18/12  3:16 PM      Result Value Range   Fecal Occult Bld POSITIVE (*) NEGATIVE  POCT I-STAT TROPONIN I     Status: None   Collection Time    12/18/12  3:19 PM      Result Value Range   Troponin i, poc 0.00  0.00 - 0.08 ng/mL   Comment 3           TYPE AND SCREEN     Status: None   Collection Time    12/18/12  3:25 PM      Result Value Range   ABO/RH(D) O NEG     Antibody Screen POS     Sample Expiration 12/21/2012     Antibody Identification PENDING     DAT, IgG PENDING    PREPARE RBC (CROSSMATCH)     Status: None   Collection Time    12/18/12  3:25 PM      Result Value Range   Order Confirmation ORDER PROCESSED BY BLOOD BANK    URINALYSIS, ROUTINE W REFLEX MICROSCOPIC     Status: Abnormal  Collection Time    12/18/12  3:32 PM      Result Value Range   Color, Urine YELLOW  YELLOW   APPearance CLOUDY (*) CLEAR   Specific Gravity, Urine 1.018  1.005 - 1.030   pH 5.0  5.0 - 8.0   Glucose, UA NEGATIVE  NEGATIVE mg/dL   Hgb urine dipstick LARGE (*) NEGATIVE   Bilirubin Urine NEGATIVE  NEGATIVE   Ketones, ur NEGATIVE  NEGATIVE mg/dL   Protein, ur NEGATIVE  NEGATIVE mg/dL   Urobilinogen, UA 0.2  0.0 - 1.0 mg/dL   Nitrite NEGATIVE  NEGATIVE   Leukocytes, UA LARGE (*) NEGATIVE  URINE MICROSCOPIC-ADD ON     Status: Abnormal   Collection Time    12/18/12  3:32 PM      Result Value Range   Squamous Epithelial / LPF RARE  RARE   WBC, UA 11-20  <3 WBC/hpf   RBC / HPF 3-6  <3 RBC/hpf   Bacteria, UA FEW (*) RARE     Dg Chest 2 View  12/18/2012   CLINICAL DATA:  Chest pain and shortness of  breath. Cough and congestion. History of lung cancer, hypertension, and diabetes.  EXAM: CHEST  2 VIEW  COMPARISON:  None.  FINDINGS: Mild wedging of a mid thoracic vertebral body. Indeterminate acuity.  Degraded portable AP view. Patient rotated to the right. Cardiomegaly. Tortuous thoracic aorta. No pleural effusion or pneumothorax. No congestive failure. Mild volume loss at the left lung base. Aortic atherosclerosis.  IMPRESSION: Cardiomegaly and low lung volumes, without acute disease.  Mid thoracic compression deformity, mild.   Electronically Signed   By: Jeronimo Greaves M.D.   On: 12/18/2012 13:56    ROS:  As stated above in the HPI otherwise negative.  Blood pressure 118/86, pulse 77, resp. rate 19, SpO2 100.00%.    PE: Gen: NAD, Alert and Oriented HEENT:  /AT, EOMI Neck: Supple, no LAD Lungs: CTA Bilaterally CV: RRR without M/G/R ABM: Soft, epigastric pain, +BS Ext: No C/C/E  Assessment/Plan: 1) Melena. 2) Anemia.  3) Heme positive stool.   4) Epigastric pain.    It appears that the patient has an upper [name] bleed.  Further evaluation with an EGD is required.  She does have epigastric abdominal pain with the physical examination.  Plan: 1) EGD tomorrow. 2) Follow HGB. 3) Transfusion as necessary. 4) Continue with PPI.  Babs Dabbs D 12/18/2012, 4:50 PM

## 2012-12-19 NOTE — Interval H&P Note (Signed)
History and Physical Interval Note:  12/19/2012 10:49 AM  [name] [name]  has presented today for surgery, with the diagnosis of anemia  The various methods of treatment have been discussed with the patient and family. After consideration of risks, benefits and other options for treatment, the patient has consented to  Procedure(s): ESOPHAGOGASTRODUODENOSCOPY (EGD) (N/A) as a surgical intervention .  The patient's history has been reviewed, patient examined, no change in status, stable for surgery.  I have reviewed the patient's chart and labs.  Questions were answered to the patient's satisfaction.     Rachael Fee

## 2012-12-19 NOTE — Progress Notes (Signed)
Patient complained of pain in the abdomen, 10/10 post procedure, patient restless, Dr Cristino Martes verbal order to give fentanyl 75 mcg  Total in recovery - given xray of the abdomen was done- result ( negative for perforation) ambulated patient to the bathroom, patient verbalized pain is better but still pain scale is still at 10 and its in the lower abdomen.

## 2012-12-19 NOTE — Op Note (Signed)
[facility] [address] [place], [address]   ENDOSCOPY PROCEDURE REPORT  PATIENT: [name] [name], [name] [name]  MR#: [id number] BIRTHDATE: 1921-07-17 , 89  yrs. old GENDER: Female ENDOSCOPIST: Rachael Fee, MD REFERRED BY:  [facility] PROCEDURE DATE:  12/19/2012 PROCEDURE:  EGD w/ biopsy and EGD w/ control of bleeding ASA CLASS:     Class III INDICATIONS:  melena, anemia. MEDICATIONS: Fentanyl 50 mcg IV and Versed 3 mg IV TOPICAL ANESTHETIC: Cetacaine Spray  DESCRIPTION OF PROCEDURE: After the risks benefits and alternatives of the procedure were thoroughly explained, informed consent was obtained.  The PENTAX GASTOROSCOPE [id number] endoscope was introduced through the mouth and advanced to the second portion of the duodenum. Without limitations.  The instrument was slowly withdrawn as the mucosa was fully examined.    There was a poorly visualized ulcer in very distal duodenal buld, just at the turn into descending duodenu.  There was significant surrounding duodenal edema which was oozing, especially near the ulcer.  The edema created swelling, narrowing of the lumen and I could not well visualize the ulcer.  Given the fresh red blood in duodenum, stomach, I elected to treat with submucosal injection of dilute epinephrine around the ulcer crater but I did not use cautery or clip given incomplete visualization, inability to see a distinct site of bleeding such as visible vessel.  There was fresh blood in stomach and duodenum (small amount).  The distal stomach was slightly inflammed, biopsied to check froo H.  pylori. Retroflexed views revealed no abnormalities.     The scope was then withdrawn from the patient and the procedure completed.  COMPLICATIONS: There were no complications. ENDOSCOPIC IMPRESSION: See above  RECOMMENDATIONS: Continue IV PPI twice daily for now.  Will consider repeat EGD this admission pending her clinical course.  If she  has no further clinical bleeding, then will continue her on twice daily PPI as out patient and see her in office in 4-5 weeks to consider repeating EGD as OP.   eSigned:  Rachael Fee, MD 12/19/2012 11:27 AM   PATIENT NAME:  [name] [name], [name] [name] MR#: [id number]

## 2012-12-19 NOTE — Progress Notes (Signed)
Pt. CBG= 80.  D5 1/2 NS hung at @ 50 ccs per hour.  Will check CBG after procedure in [facility].

## 2012-12-20 ENCOUNTER — Encounter (HOSPITAL_COMMUNITY): Payer: Self-pay | Admitting: Gastroenterology

## 2012-12-20 DIAGNOSIS — N183 Chronic kidney disease, stage 3 unspecified: Secondary | ICD-10-CM

## 2012-12-20 DIAGNOSIS — I10 Essential (primary) hypertension: Secondary | ICD-10-CM

## 2012-12-20 DIAGNOSIS — IMO0001 Reserved for inherently not codable concepts without codable children: Secondary | ICD-10-CM

## 2012-12-20 DIAGNOSIS — K264 Chronic or unspecified duodenal ulcer with hemorrhage: Principal | ICD-10-CM

## 2012-12-20 DIAGNOSIS — K922 Gastrointestinal hemorrhage, unspecified: Secondary | ICD-10-CM

## 2012-12-20 DIAGNOSIS — D649 Anemia, unspecified: Secondary | ICD-10-CM

## 2012-12-20 LAB — CBC
HCT: 25.8 % — ABNORMAL LOW (ref 36.0–46.0)
Hemoglobin: 8.7 g/dL — ABNORMAL LOW (ref 12.0–15.0)
MCH: 29.8 pg (ref 26.0–34.0)
MCHC: 33.7 g/dL (ref 30.0–36.0)
RBC: 2.92 MIL/uL — ABNORMAL LOW (ref 3.87–5.11)
WBC: 8.8 10*3/uL (ref 4.0–10.5)

## 2012-12-20 LAB — GLUCOSE, CAPILLARY
Glucose-Capillary: 100 mg/dL — ABNORMAL HIGH (ref 70–99)
Glucose-Capillary: 153 mg/dL — ABNORMAL HIGH (ref 70–99)
Glucose-Capillary: 145 mg/dL — ABNORMAL HIGH (ref 70–99)
Glucose-Capillary: 141 mg/dL — ABNORMAL HIGH (ref 70–99)

## 2012-12-20 MED ORDER — PANTOPRAZOLE SODIUM 40 MG PO TBEC
40.0000 mg | DELAYED_RELEASE_TABLET | Freq: Two times a day (BID) | ORAL | Status: DC
  Administered 2012-12-20: 40 mg via ORAL

## 2012-12-20 NOTE — Progress Notes (Signed)
[name] Daily Rounding Note 12/20/2012, 9:03 AM  LOS: 2 days  PMD is [name] [name]  SUBJECTIVE:       Still c/o dyspnea, not dizzy.  No nausea or belly pain.  No stools since before admission.  Still on clears.  OBJECTIVE:         Vital signs in last 24 hours:    Temp:  [97.8 F (36.6 C)-98.5 F (36.9 C)] 98.5 F (36.9 C) (11/18 0757) Pulse Rate:  [48-69] 48 (11/18 0757) Resp:  [10-19] 19 (11/18 0757) BP: (119-218)/(59-151) 146/71 mmHg (11/18 0757) SpO2:  [95 %-100 %] 98 % (11/18 0757) Weight:  [81 kg (178 lb 9.2 oz)] 81 kg (178 lb 9.2 oz) (11/18 0552) Last BM Date: 12/17/12 General: does not look her age   Heart: RRR, no fibrillation. Chest: reduced BS on right, no rales or ronchi.  Dyspnea but no cough at rest.  Abdomen: soft, active BS, NT, ND  Extremities: no CCE Neuro/Psych:  Pleasant.   Intake/Output from previous day: 11/17 0701 - 11/18 0700 In: 378.8 [I.V.:378.8] Out: -   Intake/Output this shift:    Lab Results:  Recent Labs  12/19/12 1423 12/19/12 2000 12/20/12 0535  WBC 9.5 9.6 8.8  HGB 9.5* 9.6* 8.7*  HCT 27.1* 27.5* 25.8*  PLT 169 183 161   BMET  Recent Labs  12/18/12 1419 12/19/12 0640  NA 137 142  K 4.0 4.6  CL 105 110  CO2 20 23  GLUCOSE 202* 67*  BUN 71* 56*  CREATININE 1.49* 1.42*  CALCIUM 8.7 8.8   LFT  Recent Labs  12/18/12 1419  PROT 5.6*  ALBUMIN 3.1*  AST 17  ALT 13  ALKPHOS 28*  BILITOT 0.1*   PT/INR  Recent Labs  12/18/12 1419  LABPROT 14.3  INR 1.13   Hepatitis Panel No results found for this basename: HEPBSAG, HCVAB, HEPAIGM, HEPBIGM,  in the last 72 hours  Studies/Results: Dg Chest 2 View  12/18/2012   CLINICAL DATA:  Chest pain and shortness of breath. Cough and congestion. History of lung cancer, hypertension, and diabetes.  EXAM: CHEST  2 VIEW  COMPARISON:  None.  FINDINGS: Mild wedging of a mid thoracic vertebral body. Indeterminate acuity.  Degraded portable AP view. Patient rotated to the  right. Cardiomegaly. Tortuous thoracic aorta. No pleural effusion or pneumothorax. No congestive failure. Mild volume loss at the left lung base. Aortic atherosclerosis.  IMPRESSION: Cardiomegaly and low lung volumes, without acute disease.  Mid thoracic compression deformity, mild.   Electronically Signed   By: Jeronimo Greaves M.D.   On: 12/18/2012 13:56   Dg Abd Portable 1v  12/19/2012   CLINICAL DATA:  Abdominal pain post EGD.  Question free air.  EXAM: PORTABLE ABDOMEN - 1 VIEW  COMPARISON:  None.  FINDINGS: A single left lateral decubitus view of the abdomen shows no definite free air. Gas is seen in mildly prominent small bowel and colon.  IMPRESSION: 1. No definite free air. 2. Gaseous distention of bowel after EGD.   Electronically Signed   By: Leanna Battles M.D.   On: 12/19/2012 12:39    ASSESMENT:   *  UGIB, Anemia. EGD 12/19/12:  Oozing bulb ulcer with edema causing luminal narrowing.  Treated with epinephrine injection.  H Pylori pending from biopsy.  S/p 2 units PRBCs  *  Hx A fib, no anticoagulation PTA.   *  Renal failure, acute on chronic.   *  UTI?, day 3  Rocephin. Urine clx shows insignificant growth.   *  DM 2  *  Hx lung cancer treated with radiation ~ 3 yrs ago. Chronic but worsening SOB.    *  Hypopituitarism, chronic hydrocortisone.    PLAN   *  BID IV Protonix, continue. *  Reconsider abx for ? UTI *  Advance to solid diet.  Hgb in AM.    [name] [name]  12/20/2012, 9:03 AM Pager: [phone]  ________________________________________________________________________  [name] [name] GI MD note:  I personally examined the patient, reviewed the data and agree with the assessment and plan described above.  EGD yesterday.  Duodenal ulcer.  AFter procedure she told me that she takes alleve (usually 2 pills almost every day). This, in combination with daily ASA could be the etiology of her ulcer. If H. Pylori biopsies are +, then will treat with appropriate antibiotics.  We will set up follow up in my office in 4-5 weeks, cbc the day prior. Probably good for her to be on single iron supplement pill once daily until then. She should stay on twice daily PPI for now, NO NSAIDS.  Hopefully she will be ok for d/c tomorrow.   Rob Bunting, MD [facility] Pager [phone]

## 2012-12-20 NOTE — Progress Notes (Addendum)
[facility] PROGRESS NOTE  [name] [name] ZOX:[id number] DOB: [birth year] DOA: 12/18/2012 PCP: Florentina Jenny, MD  [name] [name] is a 79 y.o. female with past medical history diabetes mellitus, hypertension and atrial fibrillation. Patient not in any type of anticoagulation. Patient came in to the hospital because of shortness of breath. Patient was in her usual state of health until about 5 days ago and she started to have dyspnea with exertion, dyspnea worsened. About the same time she noticed black stools, she reported that her primary care physician and she reported a nurse came and checked on her stool and it was guaiac-negative. She denies any abdominal pain, denies any vomiting. Patient did have SOB, palpitations and fatigue with exertion. She also did report some chest heaviness along with the shortness of breath.  In the ED patient hemoglobin is 6.4, she has hemoglobin of 13.0 in January of 2014. Patient admitted to the hospital for further evaluation the  Assessment/Plan:  Duodenal Ulcer  -Undergone EGD yesterday 11/17. Was found to have actively bleeding duodenal ulcer.  -States that she takes two Aleve each day. -Awaiting results of biopsy for H. Pylori. If positive then plan to treat with appropriate antibiotics per GI -Continue with PPI BID, No NSAIDS, daily per GI -Continue to monitor symptoms and CBC -Diet advanced to carb modified 11/18 per GI  Acute Blood Loss Anemia  -Secondary to upper GI bleed. -Reported melanotic stools for 5 days before admission. No stools since admission. -Transfused 2 units of packed RBCs 11/16. Hbg is 8.7 today.  -Was started on IV protonix 11/16 BID -Will continue IV PPI twice daily. Transition to oral before discharge. -Begin oral iron supplementation daily per GI -If no further bleeding may be discharged on 11/19  Type 2 diabetes mellitus  -A1c is 6.8  -Carbohydrate modified diet and insulin sliding scale - sensitive.   Atrial fibrillation   -Reported history of atrial fibrillation, currently normal sinus rhythm.  -Rate is controlled with Cardizem, on aspirin.  -Actively bleeding, not on anticoagulation.   CKD  -Creatinine baseline around 1.4, she is around her baseline now.   Panhypopituitarism  -Reported on previous note that she is on hydrocortisone 20 mg a.m. and 10 mg in the evening.  -She is on 75 mcg of Synthroid.  -Started on 20 mg of hydrocortisone PO daily per pharmacy.   History of lung cancer  -Status post radiation.   UTI  -Urinalysis is showing questionable UTI with leukocytes and bacteria. -Patient reports urinary urgency, and increased frequency, and sharp pains before urination that are relieved with urination.  -Treated empirically with Rocephin, Day 3, no growth on culture will discontinue.  Restless Legs  -New complaint of restless legs began over last several weeks.  -serum iron and ferritin are WNL -Added Robaxin q 8 PRN   Hypertension  -Improved 11/18, 125/74 -Was been high after administration of epinephrine during EGD -Patient was off of her blood pressure medications including lisinopril and amlodipine.  -Restarted home medications 11/17, hydralazine as needed for SBP of more than 160.   Code Status: FULL  Disposition Plan: discharge to home when stable 11/19   Consultants:  Gastroenterology  Procedures: ESOPHAGOGASTRODUODENOSCOPY (EGD), 11/17, Dr. Christella Hartigan  Antibiotics:  Rocephin, Day 3, Started 11/16, DC urine culture showed insignificant growth.  HPI/Subjective: Patient is feeling great today. She says that ever since received the blood transfusion feels back to normal. She states that still feels the suprapubic pain before she urinates also associated with increased frequency. Says that  she was on Augmentin starting 11/13 and does not know why, but thinks that maybe it is for the suprapubic pain, although no one told her that she had a UTI. Vocalizes understanding of plan to  watch labs to make sure she is not loosing blood and then discharge to home tomorrow if stable. Is eager to ambulate the halls today.   Objective: Filed Vitals:   12/20/12 1030  BP: 125/74  Pulse: 55  Temp: 98 F (36.7 C)  Resp:     Intake/Output Summary (Last 24 hours) at 12/20/12 1157 Last data filed at 12/20/12 0231  Gross per 24 hour  Intake 378.83 ml  Output      0 ml  Net 378.83 ml   Filed Weights   12/18/12 1652 12/20/12 0552  Weight: 79.924 kg (176 lb 3.2 oz) 81 kg (178 lb 9.2 oz)    Exam:   General:  NAD, alert and oriented x3, sitting on side of bed.  Cardiovascular: RRR, no murmur, gallops or rubs  Respiratory: CTAB, no wheezing, rhonchi, rales  Abdomen: +BS, nontender to palpation in all four, no distension noted  Musculoskeletal: +1 pitting edema in bilateral lower extremities, pedal pulses intact, full range of motion in all four.  Data Reviewed: Basic Metabolic Panel:  Recent Labs Lab 12/18/12 1419 12/19/12 0640  NA 137 142  K 4.0 4.6  CL 105 110  CO2 20 23  GLUCOSE 202* 67*  BUN 71* 56*  CREATININE 1.49* 1.42*  CALCIUM 8.7 8.8   Liver Function Tests:  Recent Labs Lab 12/18/12 1419  AST 17  ALT 13  ALKPHOS 28*  BILITOT 0.1*  PROT 5.6*  ALBUMIN 3.1*   CBC:  Recent Labs Lab 12/18/12 1419 12/19/12 0640 12/19/12 1423 12/19/12 2000 12/20/12 0535  WBC 12.6* 9.3 9.5 9.6 8.8  NEUTROABS 10.4*  --   --   --   --   HGB 6.4* 9.3* 9.5* 9.6* 8.7*  HCT 18.9* 26.3* 27.1* 27.5* 25.8*  MCV 90.9 86.5 87.1 87.6 88.4  PLT 176 155 169 183 161   Cardiac Enzymes:  Recent Labs Lab 12/18/12 1700 12/18/12 2221 12/19/12 0640  TROPONINI <0.30 <0.30 <0.30   BNP (last 3 results)  Recent Labs  12/18/12 1419  PROBNP 165.6   CBG:  Recent Labs Lab 12/19/12 1443 12/19/12 1700 12/19/12 2035 12/20/12 0816 12/20/12 1147  GLUCAP 139* 173* 102* 100* 153*    Recent Results (from the past 240 hour(s))  URINE CULTURE     Status: None    Collection Time    12/18/12  3:32 PM      Result Value Range Status   Specimen Description URINE, CLEAN CATCH   Final   Special Requests NONE   Final   Culture  Setup Time     Final   Value: 12/18/2012 20:00     Performed at [facility]   Culture     Final   Value: INSIGNIFICANT GROWTH     Performed at [facility]   Report Status 12/19/2012 FINAL   Final     Studies: Dg Chest 2 View  12/18/2012   CLINICAL DATA:  Chest pain and shortness of breath. Cough and congestion. History of lung cancer, hypertension, and diabetes.  EXAM: CHEST  2 VIEW  COMPARISON:  None.  FINDINGS: Mild wedging of a mid thoracic vertebral body. Indeterminate acuity.  Degraded portable AP view. Patient rotated to the right. Cardiomegaly. Tortuous thoracic aorta. No pleural effusion or pneumothorax.  No congestive failure. Mild volume loss at the left lung base. Aortic atherosclerosis.  IMPRESSION: Cardiomegaly and low lung volumes, without acute disease.  Mid thoracic compression deformity, mild.   Electronically Signed   By: Jeronimo Greaves M.D.   On: 12/18/2012 13:56   Dg Abd Portable 1v  12/19/2012   CLINICAL DATA:  Abdominal pain post EGD.  Question free air.  EXAM: PORTABLE ABDOMEN - 1 VIEW  COMPARISON:  None.  FINDINGS: A single left lateral decubitus view of the abdomen shows no definite free air. Gas is seen in mildly prominent small bowel and colon.  IMPRESSION: 1. No definite free air. 2. Gaseous distention of bowel after EGD.   Electronically Signed   By: Leanna Battles M.D.   On: 12/19/2012 12:39    Scheduled Meds: . cefTRIAXone (ROCEPHIN)  IV  1 g Intravenous Q24H  . diltiazem  120 mg Oral Daily  . hydrocortisone  10 mg Oral QPC lunch  . hydrocortisone  20 mg Oral Daily  . insulin aspart  0-9 Units Subcutaneous TID WC  . lisinopril  40 mg Oral Daily  . pantoprazole  40 mg Oral BID AC  . QUEtiapine  50 mg Oral QHS  . sodium chloride  3 mL Intravenous Q12H   Continuous Infusions: .  dextrose 5 % and 0.45% NaCl 1,000 mL infusion 10 mL/hr at 12/20/12 1106    Principal Problem:   Duodenal ulcer with hemorrhage Active Problems:   Type II or unspecified type diabetes mellitus without mention of complication, uncontrolled   Lung cancer - followed by Dr. Mardi Mainland at [facility]   Essential hypertension, benign   Panhypopituitarism   Anemia   Melena   CKD (chronic kidney disease), stage III   Restless legs     Georgina Quint, PA-S [address] [place], [place]  [facility] Pager [phone] If 7PM-7AM, please contact night-coverage at [email or web address], password [facility] 12/20/2012, 11:57 AM  LOS: 2 days     Addendum  Patient seen and examined, chart and data base reviewed.  I agree with the above assessment and plan.  For full details please see Mrs. Algis Downs PA note.   Clint Lipps, MD [facility] Pager: [phone] 12/20/2012, 4:15 PM

## 2012-12-21 ENCOUNTER — Encounter (HOSPITAL_COMMUNITY): Payer: Self-pay | Admitting: *Deleted

## 2012-12-21 ENCOUNTER — Encounter (HOSPITAL_COMMUNITY)
Admission: EM | Disposition: A | Discharge status: Home / Self Care | Payer: Self-pay | Source: Home / Self Care | Attending: Internal Medicine

## 2012-12-21 HISTORY — PX: ESOPHAGOGASTRODUODENOSCOPY: SHX5428

## 2012-12-21 LAB — BASIC METABOLIC PANEL
BUN: 42 mg/dL — ABNORMAL HIGH (ref 6–23)
Calcium: 7.5 mg/dL — ABNORMAL LOW (ref 8.4–10.5)
Chloride: 110 mEq/L (ref 96–112)
Creatinine, Ser: 1.53 mg/dL — ABNORMAL HIGH (ref 0.50–1.10)
GFR calc Af Amer: 34 mL/min — ABNORMAL LOW (ref >90)
GFR calc non Af Amer: 29 mL/min — ABNORMAL LOW (ref >90)
Sodium: 139 mEq/L (ref 135–145)

## 2012-12-21 LAB — CBC
HCT: 19.6 % — ABNORMAL LOW (ref 36.0–46.0)
Hemoglobin: 6.7 g/dL — CL (ref 12.0–15.0)
MCH: 30.7 pg (ref 26.0–34.0)
MCV: 89.9 fL (ref 78.0–100.0)
RBC: 2.18 MIL/uL — ABNORMAL LOW (ref 3.87–5.11)
RDW: 16.1 % — ABNORMAL HIGH (ref 11.5–15.5)
WBC: 9 10*3/uL (ref 4.0–10.5)

## 2012-12-21 LAB — GLUCOSE, CAPILLARY
Glucose-Capillary: 140 mg/dL — ABNORMAL HIGH (ref 70–99)
Glucose-Capillary: 140 mg/dL — ABNORMAL HIGH (ref 70–99)
Glucose-Capillary: 213 mg/dL — ABNORMAL HIGH (ref 70–99)
Glucose-Capillary: 89 mg/dL (ref 70–99)

## 2012-12-21 LAB — PREPARE RBC (CROSSMATCH)

## 2012-12-21 SURGERY — EGD (ESOPHAGOGASTRODUODENOSCOPY)
Anesthesia: Moderate Sedation

## 2012-12-21 MED ORDER — FENTANYL CITRATE 0.05 MG/ML IJ SOLN
INTRAMUSCULAR | Status: DC | PRN
  Administered 2012-12-21 (×2): 25 ug via INTRAVENOUS

## 2012-12-21 MED ORDER — FENTANYL CITRATE 0.05 MG/ML IJ SOLN
INTRAMUSCULAR | Status: AC
  Filled 2012-12-21: qty 2

## 2012-12-21 MED ORDER — INSULIN ASPART 100 UNIT/ML ~~LOC~~ SOLN
0.0000 [IU] | SUBCUTANEOUS | Status: DC
  Administered 2012-12-21: 18:00:00 3 [IU] via SUBCUTANEOUS
  Administered 2012-12-22: 13:00:00 2 [IU] via SUBCUTANEOUS
  Administered 2012-12-22: 3 [IU] via SUBCUTANEOUS

## 2012-12-21 MED ORDER — SODIUM CHLORIDE 0.9 % IV BOLUS (SEPSIS)
250.0000 mL | Freq: Once | INTRAVENOUS | Status: AC
  Administered 2012-12-21: 250 mL via INTRAVENOUS

## 2012-12-21 MED ORDER — SODIUM CHLORIDE 0.9 % IJ SOLN
PREFILLED_SYRINGE | INTRAMUSCULAR | Status: DC | PRN
  Administered 2012-12-21: 11:00:00

## 2012-12-21 MED ORDER — DIPHENHYDRAMINE HCL 50 MG/ML IJ SOLN
INTRAMUSCULAR | Status: AC
  Filled 2012-12-21: qty 1

## 2012-12-21 MED ORDER — EPINEPHRINE HCL 0.1 MG/ML IJ SOSY
PREFILLED_SYRINGE | INTRAMUSCULAR | Status: AC
  Filled 2012-12-21: qty 10

## 2012-12-21 MED ORDER — PANTOPRAZOLE SODIUM 40 MG IV SOLR
40.0000 mg | Freq: Two times a day (BID) | INTRAVENOUS | Status: DC
  Administered 2012-12-21 (×2): 40 mg via INTRAVENOUS
  Filled 2012-12-21 (×4): qty 40

## 2012-12-21 MED ORDER — MIDAZOLAM HCL 5 MG/ML IJ SOLN
INTRAMUSCULAR | Status: AC
  Filled 2012-12-21: qty 2

## 2012-12-21 MED ORDER — BUTAMBEN-TETRACAINE-BENZOCAINE 2-2-14 % EX AERO
INHALATION_SPRAY | CUTANEOUS | Status: DC | PRN
  Administered 2012-12-21: 2 via TOPICAL

## 2012-12-21 MED ORDER — LISINOPRIL 10 MG PO TABS
10.0000 mg | ORAL_TABLET | Freq: Every day | ORAL | Status: DC
  Administered 2012-12-22 – 2012-12-23 (×2): 10 mg via ORAL
  Filled 2012-12-21 (×2): qty 1

## 2012-12-21 MED ORDER — MIDAZOLAM HCL 10 MG/2ML IJ SOLN
INTRAMUSCULAR | Status: DC | PRN
  Administered 2012-12-21: 1 mg via INTRAVENOUS
  Administered 2012-12-21: 2 mg via INTRAVENOUS
  Administered 2012-12-21 (×2): 1 mg via INTRAVENOUS

## 2012-12-21 MED ORDER — DIPHENHYDRAMINE HCL 50 MG/ML IJ SOLN
INTRAMUSCULAR | Status: DC | PRN
  Administered 2012-12-21: 25 mg via INTRAVENOUS

## 2012-12-21 MED ORDER — DILTIAZEM HCL ER COATED BEADS 120 MG PO CP24
120.0000 mg | ORAL_CAPSULE | Freq: Every day | ORAL | Status: DC
  Administered 2012-12-22 – 2012-12-23 (×2): 120 mg via ORAL
  Filled 2012-12-21 (×2): qty 1

## 2012-12-21 MED ORDER — SODIUM CHLORIDE 0.9 % IV SOLN
INTRAVENOUS | Status: DC
  Administered 2012-12-21: 500 mL via INTRAVENOUS

## 2012-12-21 NOTE — Progress Notes (Addendum)
[facility] PROGRESS NOTE  Celia Friedland ZOX:[id number] DOB: 08-24-1921 DOA: 12/18/2012 PCP: Florentina Jenny, MD  Assessment/Plan: Duodenal Ulcer  -Undergone EGD 11/18. Was found to have oozing bulb ulcer in duodenum -Repeat EGD this AM due to continued bleeding and decreased Hbg 6.7.  Duodenal bulb ulcer injected and cauterized. -she takes two Aleve each day.  -Awaiting results of biopsy for H. Pylori. If positive then plan to treat with appropriate antibiotics per GI  -Continue with IV PPI BID for another 24 hours.  No NSAIDS, daily per GI  -Continue to monitor symptoms and CBC  -Diet clears.  Acute Blood Loss Anemia  -Secondary to upper GI bleed.  -Reported melanotic stools for 5 days before admission. -Transfused 2 units of packed RBCs 11/16. 3 additional units given on 11/19. -Has 5 melanotic/bloody stools last night (11/18) and Hbg is 6.7 this AM. -EGD scheduled this AM. -Will continue oral iron supplementation outpatient.  Type 2 diabetes mellitus  -A1c is 6.8  -Carbohydrate modified diet and insulin sliding scale - sensitive.   Atrial fibrillation  -Reported history of atrial fibrillation, currently normal sinus rhythm.  -Rate is controlled with Cardizem, on aspirin.  -Actively bleeding, not on anticoagulation.    CKD  -Creatinine baseline around 1.4, she is around her baseline now.    Panhypopituitarism  -Reported on previous note that she is on hydrocortisone 20 mg a.m. and 10 mg in the evening.  -She is on 75 mcg of Synthroid.  -Started on 20 mg of hydrocortisone PO daily per pharmacy.    History of lung cancer  -Status post radiation.    UTI  -Urinalysis is showing questionable UTI with leukocytes and bacteria.  -Patient reports urinary urgency, and increased frequency, and sharp pains before urination that are relieved with urination.  -Treated empirically with Rocephin, Day 4. No growth likely due to treatment with Augmentin starting  11/13   Restless Legs  -New complaint of restless legs began over last several weeks.  -serum iron and ferritin are WNL  -Added Robaxin q 8 PRN    Hypertension  -BP up and down due to bleeding and epi injections. -Was been high after administration of epinephrine during EGD  -Restarted home medications 11/17 with hold parameters, hydralazine as needed for SBP of more than 160.   Code Status: FULL Family Communication:  Disposition Plan: inpatient.   Consultants:  Gastroenterology  Procedures:  EGD, 11/17  EGD, 11/19  Antibiotics:  Rocephin started 11/16. Day 4  HPI/Subjective: Patient feels week today. Is very tired and is in moderate distress. Complaining of dizziness.  Objective: Filed Vitals:   12/21/12 1208  BP: 177/55  Pulse:   Temp:   Resp: 14    Intake/Output Summary (Last 24 hours) at 12/21/12 1249 Last data filed at 12/21/12 1201  Gross per 24 hour  Intake 1284.5 ml  Output      0 ml  Net 1284.5 ml   Filed Weights   12/18/12 1652 12/20/12 0552 12/21/12 0601  Weight: 79.924 kg (176 lb 3.2 oz) 81 kg (178 lb 9.2 oz) 80.1 kg (176 lb 9.4 oz)    Exam:   General:  Awake, alert, appears fatigued. Pale.  HOH  Cardiovascular: slightly difficult to hear.  Slightly brady.  No obvious m/r/g  Respiratory: no w/c/r, no accessory muscle use.  Abdomen: soft, nt, nd, +bs, no masses  Musculoskeletal: able to move all four.  Data Reviewed: Basic Metabolic Panel:  Recent Labs Lab 12/18/12 1419 12/19/12 0640 12/21/12 0057  NA 137 142 139  K 4.0 4.6 4.3  CL 105 110 110  CO2 20 23 22   GLUCOSE 202* 67* 148*  BUN 71* 56* 42*  CREATININE 1.49* 1.42* 1.53*  CALCIUM 8.7 8.8 7.5*   Liver Function Tests:  Recent Labs Lab 12/18/12 1419  AST 17  ALT 13  ALKPHOS 28*  BILITOT 0.1*  PROT 5.6*  ALBUMIN 3.1*   CBC:  Recent Labs Lab 12/18/12 1419 12/19/12 0640 12/19/12 1423 12/19/12 2000 12/20/12 0535 12/21/12 0057  WBC 12.6* 9.3 9.5 9.6  8.8 9.0  NEUTROABS 10.4*  --   --   --   --   --   HGB 6.4* 9.3* 9.5* 9.6* 8.7* 6.7*  HCT 18.9* 26.3* 27.1* 27.5* 25.8* 19.6*  MCV 90.9 86.5 87.1 87.6 88.4 89.9  PLT 176 155 169 183 161 151   Cardiac Enzymes:  Recent Labs Lab 12/18/12 1700 12/18/12 2221 12/19/12 0640  TROPONINI <0.30 <0.30 <0.30   BNP (last 3 results)  Recent Labs  12/18/12 1419  PROBNP 165.6   CBG:  Recent Labs Lab 12/20/12 1648 12/20/12 2139 12/20/12 2350 12/21/12 0353 12/21/12 0735  GLUCAP 145* 141* 140* 114* 89    Recent Results (from the past 240 hour(s))  URINE CULTURE     Status: None   Collection Time    12/18/12  3:32 PM      Result Value Range Status   Specimen Description URINE, CLEAN CATCH   Final   Special Requests NONE   Final   Culture  Setup Time     Final   Value: 12/18/2012 20:00     Performed at [facility]   Culture     Final   Value: INSIGNIFICANT GROWTH     Performed at [facility]   Report Status 12/19/2012 FINAL   Final     Studies: No results found.  Scheduled Meds: . [MAR HOLD] cefTRIAXone (ROCEPHIN)  IV  1 g Intravenous Q24H  . [START ON 12/22/2012] diltiazem  120 mg Oral Daily  . [MAR HOLD] hydrocortisone  10 mg Oral QPC lunch  . [facility] HOLD] hydrocortisone  20 mg Oral Daily  . [MAR HOLD] insulin aspart  0-9 Units Subcutaneous Q4H  . [START ON 12/22/2012] lisinopril  10 mg Oral Daily  . [MAR HOLD] pantoprazole (PROTONIX) IV  40 mg Intravenous Q12H  . [MAR HOLD] QUEtiapine  50 mg Oral QHS  . [facility] HOLD] sodium chloride  3 mL Intravenous Q12H   Continuous Infusions: . sodium chloride 500 mL (12/21/12 0947)    Principal Problem:   Duodenal ulcer with hemorrhage Active Problems:   Type II or unspecified type diabetes mellitus without mention of complication, uncontrolled   Lung cancer - followed by Dr. Mardi Mainland at [facility]   Essential hypertension, benign   Panhypopituitarism   Anemia   Melena   CKD (chronic kidney disease), stage III    Restless legs      Georgina Quint, PA-S [address] [place], [place]  [facility] Pager [phone]  If 7PM-7AM, please contact night-coverage at [email or web address], password [facility] 12/21/2012, 12:49 PM  LOS: 3 days    Attending Patient seen and examined, admitted with GI bleeding secondary to a duodenal ulcer seen on EGD on 11/18. However continue to have melanotic stools last night, repeat EGD done today-bleeding vessels seen in the duodenal ulcer that was cauterized. We'll continue to transfuse PRBC as needed, will monitor closely.  Above documentation was reviewed, agree with the above  assessment and plan  Windell Norfolk MD

## 2012-12-21 NOTE — Progress Notes (Signed)
Shift event:  Ms. Lantigua here for GIB and found to have bleeding ulcer on endoscopy per GI. She had not had any bloody stools since admission. Tonight, she has had 3 stools with bright red bleeding. RN said pt c/o dizziness which resolved and BP was 107 systolic (which is decreased). Stat H/H done which showed Hgb of 6.7 which is a 2 Gram loss since last CBC on 12/20/12.  A/P: 1. GIB with active bleeding and symptomatic anemia-TF 2 U. Serial CBCs q 6hrs x 3 starting 0800. H/H after transfusion finished. Monitor symptoms but expect BP to come up and symptoms to subside after volume replacement with blood. Will not start IVF at this time unless BP conts to fall. GI will likely need to see again this am.  Placed on clear liquid diet. Changed Protonix back to IV q12 hrs. Pt on iron supplementation.  2. DM II- Because of change to clear liquid diet, change CBGs to q4 h with SSI. 3. HTN-lower BP tonight. May need to decrease meds, will monitor.  Will cont to follow. Jimmye Norman, NP [facility]

## 2012-12-21 NOTE — Progress Notes (Signed)
Notified Craige Cotta, NP that patient has had 3 medium size black bloody bowel movements within the last 15 minutes. Patient felt that her blood sugar was low because she was dizzy. CBG was 140. Gave patient peaches and applesauce per pt request.  Tried to explain to patient that she is probably feeling dizzy because she is having bloody bowel movements.  Bufford Spikes, NP that patient requesting to talk to MD. Patient states that she feels better now and is not dizzy anymore. VS stable. Craige Cotta, NP ordered CBC. Will continue to monitor patient. Nelda Marseille, RN

## 2012-12-21 NOTE — H&P (View-Only) (Signed)
   [facility] Daily Rounding Note 12/20/2012, 9:03 AM  LOS: 2 days  PMD is Henry Tripp  SUBJECTIVE:       Still c/o dyspnea, not dizzy.  No nausea or belly pain.  No stools since before admission.  Still on clears.  OBJECTIVE:         Vital signs in last 24 hours:    Temp:  [97.8 F (36.6 C)-98.5 F (36.9 C)] 98.5 F (36.9 C) (11/18 0757) Pulse Rate:  [48-69] 48 (11/18 0757) Resp:  [10-19] 19 (11/18 0757) BP: (119-218)/(59-151) 146/71 mmHg (11/18 0757) SpO2:  [95 %-100 %] 98 % (11/18 0757) Weight:  [81 kg (178 lb 9.2 oz)] 81 kg (178 lb 9.2 oz) (11/18 0552) Last BM Date: 12/17/12 General: does not look her age   Heart: RRR, no fibrillation. Chest: reduced BS on right, no rales or ronchi.  Dyspnea but no cough at rest.  Abdomen: soft, active BS, NT, ND  Extremities: no CCE Neuro/Psych:  Pleasant.   Intake/Output from previous day: 11/17 0701 - 11/18 0700 In: 378.8 [I.V.:378.8] Out: -   Intake/Output this shift:    Lab Results:  Recent Labs  12/19/12 1423 12/19/12 2000 12/20/12 0535  WBC 9.5 9.6 8.8  HGB 9.5* 9.6* 8.7*  HCT 27.1* 27.5* 25.8*  PLT 169 183 161   BMET  Recent Labs  12/18/12 1419 12/19/12 0640  NA 137 142  K 4.0 4.6  CL 105 110  CO2 20 23  GLUCOSE 202* 67*  BUN 71* 56*  CREATININE 1.49* 1.42*  CALCIUM 8.7 8.8   LFT  Recent Labs  12/18/12 1419  PROT 5.6*  ALBUMIN 3.1*  AST 17  ALT 13  ALKPHOS 28*  BILITOT 0.1*   PT/INR  Recent Labs  12/18/12 1419  LABPROT 14.3  INR 1.13   Hepatitis Panel No results found for this basename: HEPBSAG, HCVAB, HEPAIGM, HEPBIGM,  in the last 72 hours  Studies/Results: Dg Chest 2 View  12/18/2012   CLINICAL DATA:  Chest pain and shortness of breath. Cough and congestion. History of lung cancer, hypertension, and diabetes.  EXAM: CHEST  2 VIEW  COMPARISON:  None.  FINDINGS: Mild wedging of a mid thoracic vertebral body. Indeterminate acuity.  Degraded portable AP view. Patient rotated to the  right. Cardiomegaly. Tortuous thoracic aorta. No pleural effusion or pneumothorax. No congestive failure. Mild volume loss at the left lung base. Aortic atherosclerosis.  IMPRESSION: Cardiomegaly and low lung volumes, without acute disease.  Mid thoracic compression deformity, mild.   Electronically Signed   By: Kyle  Talbot M.D.   On: 12/18/2012 13:56   Dg Abd Portable 1v  12/19/2012   CLINICAL DATA:  Abdominal pain post EGD.  Question free air.  EXAM: PORTABLE ABDOMEN - 1 VIEW  COMPARISON:  None.  FINDINGS: A single left lateral decubitus view of the abdomen shows no definite free air. Gas is seen in mildly prominent small bowel and colon.  IMPRESSION: 1. No definite free air. 2. Gaseous distention of bowel after EGD.   Electronically Signed   By: Melinda  Blietz M.D.   On: 12/19/2012 12:39    ASSESMENT:   *  UGIB, Anemia. EGD 12/19/12:  Oozing bulb ulcer with edema causing luminal narrowing.  Treated with epinephrine injection.  H Pylori pending from biopsy.  S/p 2 units PRBCs  *  Hx A fib, no anticoagulation PTA.   *  Renal failure, acute on chronic.   *  UTI?, day 3   Rocephin. Urine clx shows insignificant growth.   *  DM 2  *  Hx lung cancer treated with radiation ~ 3 yrs ago. Chronic but worsening SOB.    *  Hypopituitarism, chronic hydrocortisone.    PLAN   *  BID IV Protonix, continue. *  Reconsider abx for ? UTI *  Advance to solid diet.  Hgb in AM.    Sarah Gribbin  12/20/2012, 9:03 AM Pager: [phone]  ________________________________________________________________________  [facility] MD note:  I personally examined the patient, reviewed the data and agree with the assessment and plan described above.  EGD yesterday.  Duodenal ulcer.  AFter procedure she told me that she takes alleve (usually 2 pills almost every day). This, in combination with daily ASA could be the etiology of her ulcer. If H. Pylori biopsies are +, then will treat with appropriate antibiotics.  We will set up follow up in my office in 4-5 weeks, cbc the day prior. Probably good for her to be on single iron supplement pill once daily until then. She should stay on twice daily PPI for now, NO NSAIDS.  Hopefully she will be ok for d/c tomorrow.   Kimberely Mccannon, MD [facility] Pager [phone]  

## 2012-12-21 NOTE — Progress Notes (Signed)
Update: pt with antibodies, so blood delayed. BP in low 90s so 250cc NS bolus given prior to blood starting. Order to hold Lisinopril until BP gets up to the 140s. PT/PTT/INR normal on 12/18/12. Reported to oncoming attending at 0700.   Jimmye Norman, NP [facility]

## 2012-12-21 NOTE — Progress Notes (Signed)
[facility] Daily Rounding Note 12/21/2012, 8:18 AM  LOS: 3 days   SUBJECTIVE:       5 episodes of melenic and/or frankly bloody stool overnight.  Pt feeling woozy.  Got one unit PRBCs after delay with match due to pt's autoantibodies.  Second ready for transfusion but pt's peripheral IV is blown and 3 attempts by floor RNs failed to gain new access, so waiting on IV team for access.   This AMs H & H reflects level before getting the PRBC. Pt feels anxious, no chest or belly pain.  Blood pressures down to nadir of 91/53.    OBJECTIVE:         Vital signs in last 24 hours:    Temp:  [97.3 F (36.3 C)-98.7 F (37.1 C)] 98.1 F (36.7 C) (11/19 0755) Pulse Rate:  [55-76] 56 (11/19 0755) Resp:  [20-24] 20 (11/19 0755) BP: (91-151)/(52-80) 117/67 mmHg (11/19 0755) SpO2:  [95 %-100 %] 98 % (11/19 0755) Weight:  [80.1 kg (176 lb 9.4 oz)] 80.1 kg (176 lb 9.4 oz) (11/19 0601) Last BM Date: 12/21/12 General: alert, oriented, anxious   Heart: RRR, no MRG Chest: clear, less dyspneic than yesterday. Abdomen: soft, NT, ND, BS hypoactive.   Extremities: no CCE Neuro/Psych:  Anxious, not confused, no gross deficits.   Intake/Output from previous day: 11/18 0701 - 11/19 0700 In: 1090.5 [P.O.:600; Blood:240.5; IV Piggyback:250] Out: -   Intake/Output this shift: Total I/O In: 134 [Blood:134] Out: -   Lab Results:  Recent Labs  12/19/12 2000 12/20/12 0535 12/21/12 0057  WBC 9.6 8.8 9.0  HGB 9.6* 8.7* 6.7*  HCT 27.5* 25.8* 19.6*  PLT 183 161 151   BMET  Recent Labs  12/18/12 1419 12/19/12 0640 12/21/12 0057  NA 137 142 139  K 4.0 4.6 4.3  CL 105 110 110  CO2 20 23 22   GLUCOSE 202* 67* 148*  BUN 71* 56* 42*  CREATININE 1.49* 1.42* 1.53*  CALCIUM 8.7 8.8 7.5*   LFT  Recent Labs  12/18/12 1419  PROT 5.6*  ALBUMIN 3.1*  AST 17  ALT 13  ALKPHOS 28*  BILITOT 0.1*   PT/INR  Recent Labs  12/18/12 1419  LABPROT 14.3  INR 1.13    Studies/Results: Dg Abd  Portable 1v 12/19/2012   CLINICAL DATA:  Abdominal pain post EGD.  Question free air.  EXAM: PORTABLE ABDOMEN - 1 VIEW  COMPARISON:  None.  FINDINGS: A single left lateral decubitus view of the abdomen shows no definite free air. Gas is seen in mildly prominent small bowel and colon.  IMPRESSION: 1. No definite free air. 2. Gaseous distention of bowel after EGD.   Electronically Signed   By: Leanna Battles M.D.   On: 12/19/2012 12:39    ASSESMENT:  * UGIB EGD 12/19/12: Oozing bulb ulcer with edema causing luminal narrowing. Treated with epinephrine injection.  H Pylori pending from biopsy.  *  ABL anemia.  Hgb dropped 2 grams in last 24 hours. S/p 2 units PRBCs early in admission and 1 more unit this AM.  Further transfusions delayed until IV access reestablished.   * Hx A fib, no anticoagulation PTA.  * Renal failure, acute on chronic.  * UTI?, day 4 Rocephin. Urine clx shows insignificant growth. ? Need for abx? * DM 2  * Hx lung cancer treated with radiation ~ 3 yrs ago. Chronic but worsening SOB.  * Hypopituitarism, chronic hydrocortisone.    PLAN   *  EGD today if/when IV access established.     [name]  12/21/2012, 8:18 AM Pager: [phone]  ________________________________________________________________________  [facility] MD note:  I personally examined the patient, reviewed the data and agree with the assessment and plan described above.  Rebleeding, likely from DU.  Planning on repeat EGD this morning.   Rob Bunting, MD [facility] Pager [phone]

## 2012-12-21 NOTE — Op Note (Signed)
[facility] [address] [place], [address]   ENDOSCOPY PROCEDURE REPORT  PATIENT: [name], [name]  MR#: [id number] BIRTHDATE: 05/19/1921 , 89  yrs. old GENDER: Female ENDOSCOPIST: Rachael Fee, MD PROCEDURE DATE:  12/21/2012 PROCEDURE:  EGD w/ control of bleeding ASA CLASS:     Class IV INDICATIONS:  known dudoenal ulcer, recurrent melena, acute drop in Hb overnight. MEDICATIONS: Fentanyl 50 mcg IV, Versed 5 mg IV, and Benadryl 25 mg IV TOPICAL ANESTHETIC: none  DESCRIPTION OF PROCEDURE: After the risks benefits and alternatives of the procedure were thoroughly explained, informed consent was obtained.  The Pentax Gastroscope [id number] endoscope was introduced through the mouth and advanced to the second portion of the duodenum. Without limitations.  The instrument was slowly withdrawn as the mucosa was fully examined.    There was no blood in UGI tract.  The previously noted distal dudoenal bulb ulcer was much better visualized today, the periulcer mucosal edema has significantly improved since EGD two days ago. Ther was a very clear small visible vessel at one edge of the ulcer crater (raised red nodule with slight pulsations).  The area was treated with injection of dilute epinephrine and then ablated with Bipolar Cautery (200, 20W setting through a7Fr gold probe).  The examination was otherwise normal.  Retroflexion was not performed. The scope was then withdrawn from the patient and the procedure completed.  COMPLICATIONS: There were no complications. ENDOSCOPIC IMPRESSION: There was no blood in UGI tract.  The previously noted distal dudoenal bulb ulcer was much better visualized today, the periulcer mucosal edema has significantly improved since EGD two days ago. Ther was a very clear small visible vessel at one edge of the ulcer crater (raised red nodule with slight pulsations).  The area was treated with injection of dilute  epinephrine and then ablated with Bipolar Cautery (200, 20W setting through a7Fr gold probe).  The examination was otherwise normal.  RECOMMENDATIONS: Continue IV PPI for another 24 hours, clear liquids today.  She needs one more unit of blood today after adequate IV access is obtained.   eSigned:  Rachael Fee, MD 12/21/2012 11:45 AM

## 2012-12-21 NOTE — Progress Notes (Signed)
CRITICAL VALUE ALERT  Critical value received:  Hgb: 6.7     Date of notification:  12/21/12  Time of notification:  0140   Critical value read back:yes  Nurse who received alert:  Nelda Marseille, RN  MD notified (1st page):  Craige Cotta, NP   Time of first page:  0141  MD notified (2nd page):  Time of second page:  Responding MD:  Craige Cotta, NP    Time MD responded:  [phone]

## 2012-12-21 NOTE — Progress Notes (Signed)
Notified Craige Cotta, NP that patient's BP is 91/53. Craige Cotta, NP gave order to give NS bolus then start PRBC's. Will continue to monitor patient. Nelda Marseille, RN

## 2012-12-21 NOTE — Interval H&P Note (Signed)
History and Physical Interval Note:  12/21/2012 10:39 AM  [name] [name]  has presented today for surgery, with the diagnosis of gi bleed  The various methods of treatment have been discussed with the patient and family. After consideration of risks, benefits and other options for treatment, the patient has consented to  Procedure(s): ESOPHAGOGASTRODUODENOSCOPY (EGD) (N/A) as a surgical intervention .  The patient's history has been reviewed, patient examined, no change in status, stable for surgery.  I have reviewed the patient's chart and labs.  Questions were answered to the patient's satisfaction.     Rachael Fee

## 2012-12-22 ENCOUNTER — Encounter (HOSPITAL_COMMUNITY): Payer: Self-pay | Admitting: Gastroenterology

## 2012-12-22 LAB — GLUCOSE, CAPILLARY
Glucose-Capillary: 112 mg/dL — ABNORMAL HIGH (ref 70–99)
Glucose-Capillary: 159 mg/dL — ABNORMAL HIGH (ref 70–99)
Glucose-Capillary: 222 mg/dL — ABNORMAL HIGH (ref 70–99)
Glucose-Capillary: 255 mg/dL — ABNORMAL HIGH (ref 70–99)
Glucose-Capillary: 65 mg/dL — ABNORMAL LOW (ref 70–99)
Glucose-Capillary: 76 mg/dL (ref 70–99)
Glucose-Capillary: 85 mg/dL (ref 70–99)
Glucose-Capillary: 90 mg/dL (ref 70–99)

## 2012-12-22 LAB — TYPE AND SCREEN
ABO/RH(D): O NEG
Antibody Screen: POSITIVE
DAT, IgG: NEGATIVE
Unit division: 0
Unit division: 0
Unit division: 0

## 2012-12-22 LAB — BASIC METABOLIC PANEL
CO2: 22 mEq/L (ref 19–32)
Calcium: 7.9 mg/dL — ABNORMAL LOW (ref 8.4–10.5)
Creatinine, Ser: 1.46 mg/dL — ABNORMAL HIGH (ref 0.50–1.10)
GFR calc Af Amer: 36 mL/min — ABNORMAL LOW (ref >90)
GFR calc non Af Amer: 31 mL/min — ABNORMAL LOW (ref >90)
Glucose, Bld: 84 mg/dL (ref 70–99)
Potassium: 4.8 mEq/L (ref 3.5–5.1)
Sodium: 138 mEq/L (ref 135–145)

## 2012-12-22 LAB — CBC
HCT: 34.6 % — ABNORMAL LOW (ref 36.0–46.0)
Hemoglobin: 12 g/dL (ref 12.0–15.0)
RDW: 15.7 % — ABNORMAL HIGH (ref 11.5–15.5)
WBC: 11.9 10*3/uL — ABNORMAL HIGH (ref 4.0–10.5)

## 2012-12-22 MED ORDER — PANTOPRAZOLE SODIUM 40 MG PO TBEC
40.0000 mg | DELAYED_RELEASE_TABLET | Freq: Two times a day (BID) | ORAL | Status: DC
  Administered 2012-12-22 – 2012-12-23 (×3): 40 mg via ORAL
  Filled 2012-12-22 (×2): qty 1

## 2012-12-22 MED ORDER — INSULIN ASPART 100 UNIT/ML ~~LOC~~ SOLN: 0.0000 [IU] | Freq: Three times a day (TID) | SUBCUTANEOUS | Status: DC

## 2012-12-22 NOTE — Progress Notes (Addendum)
[facility] PROGRESS NOTE  [name] [name] ZOX:[id number] DOB: [birth year] DOA: 12/18/2012 PCP: Florentina Jenny, MD  Assessment/Plan: Duodenal Ulcer  -Underwent EGD 11/17. Was found to have oozing ulcer in duodenal bulb -Repeat EGD 11/19 due to continued bleeding and decreased Hbg 6.7.  Duodenal bulb ulcer injected and cauterized. -She takes two Aleve each day and is on an aspirin regimen -H. Pylori neg on biopsy, so likely due to NSAID use. -Continue with PPI BID, no NSAIDS, daily per GI. -Continue to monitor symptoms and CBC  -Diet advanced to heart healthy today. -If stable will plan to discharge tomorrow  Acute Blood Loss Anemia  -Secondary to upper GI bleed.  -Reported melanotic stools for 5 days before admission. -Transfused 2 units of packed RBCs 11/16.  -Has 5 melanotic/bloody stools on 11/18 and Hbg is 6.7 in AM on 11/19 -4 additional units PRBC given on 11/19. Hbg is 12 today. Expect it to equilibrate to approx 10 without further bleeding. -EGD 11/19 showed duodenal bulb ulcer. Was injected and cauterized. -Will continue oral iron supplementation outpatient.  Type 2 diabetes mellitus  -A1c is 6.8  -Carbohydrate modified diet and insulin sliding scale - sensitive.   Atrial fibrillation  -Reported history of atrial fibrillation, currently normal sinus rhythm.  -Rate is controlled with Cardizem, on aspirin.  -Actively bleeding, not on anticoagulation.  Need to determine when/if this should be restarted.  CKD  -Creatinine baseline around 1.4, she is around her baseline now (1.46 today)  Panhypopituitarism  -Reported on previous note that she is on hydrocortisone 20 mg a.m. and 10 mg in the evening.  -She is on 75 mcg of Synthroid.  -Started on 20 mg of hydrocortisone PO daily per pharmacy.   History of lung cancer  -Status post radiation.   UTI  -Urinalysis is showing questionable UTI with leukocytes and bacteria.  -Patient reports urinary urgency, and increased  frequency, and sharp pains before urination that are relieved with urination.  -Treated empirically with Rocephin, Day 5. No growth likely due to treatment with Augmentin starting 11/13. Will discontinue upon discharge.  Restless Legs  -New complaint of restless legs began over last several weeks.  -serum iron and ferritin are WNL  -Added Robaxin q 8 PRN   Hypertension  -BP up and down due to bleeding and epi injections. -Was been high after administration of epinephrine during EGD  -Restarted home medications 11/17 with hold parameters, hydralazine as needed for SBP of more than 160.  Deconditioning weakness -likely due to age / hospital stay / and acute illness -PT ordered.   Code Status: FULL Family Communication:  Disposition Plan: inpatient.   Consultants:  Gastroenterology  Procedures:  EGD, 11/17  EGD, 11/19  Antibiotics:  Rocephin started 11/16.   HPI/Subjective: Patient feels well today. Has more energy than yesterday and is ready to leave. Discussed need to wait until tomorrow to make sure that bleeding does not reoccur. Denies N/V, bloody/dark stools, fever, SOB, cough, light headedness. States that she will try walking around today.   Objective: Filed Vitals:   12/22/12 0554  BP: 130/71  Pulse: 97  Temp: 99 F (37.2 C)  Resp: 20    Intake/Output Summary (Last 24 hours) at 12/22/12 1226 Last data filed at 12/22/12 0957  Gross per 24 hour  Intake   1008 ml  Output      0 ml  Net   1008 ml   Filed Weights   12/20/12 0552 12/21/12 0601 12/22/12 0554  Weight: 81 kg (  178 lb 9.2 oz) 80.1 kg (176 lb 9.4 oz) 85.7 kg (188 lb 15 oz)    Exam:   General:  Awake, alert, was seen walking around her room. She appeared unsteady and was advised to remain seated unless accompanied by a nurse. She was left with the bed alarm on.  Cardiovascular: RRR.  No murmur, gallop, rub  Respiratory: CTAB, no crackles, rales, rhonchi, wheezing.  Abdomen: +BS,  non-tender to palpation, mild distension, no masses  Musculoskeletal: full ROM in all four extremities, no edema noted, no erythema  Data Reviewed: Basic Metabolic Panel:  Recent Labs Lab 12/18/12 1419 12/19/12 0640 12/21/12 0057 12/22/12 0143  NA 137 142 139 138  K 4.0 4.6 4.3 4.8  CL 105 110 110 106  CO2 20 23 22 22   GLUCOSE 202* 67* 148* 84  BUN 71* 56* 42* 32*  CREATININE 1.49* 1.42* 1.53* 1.46*  CALCIUM 8.7 8.8 7.5* 7.9*   Liver Function Tests:  Recent Labs Lab 12/18/12 1419  AST 17  ALT 13  ALKPHOS 28*  BILITOT 0.1*  PROT 5.6*  ALBUMIN 3.1*   CBC:  Recent Labs Lab 12/18/12 1419  12/19/12 1423 12/19/12 2000 12/20/12 0535 12/21/12 0057 12/22/12 0051  WBC 12.6*  < > 9.5 9.6 8.8 9.0 11.9*  NEUTROABS 10.4*  --   --   --   --   --   --   HGB 6.4*  < > 9.5* 9.6* 8.7* 6.7* 12.0  HCT 18.9*  < > 27.1* 27.5* 25.8* 19.6* 34.6*  MCV 90.9  < > 87.1 87.6 88.4 89.9 85.9  PLT 176  < > 169 183 161 151 129*  < > = values in this interval not displayed. Cardiac Enzymes:  Recent Labs Lab 12/18/12 1700 12/18/12 2221 12/19/12 0640  TROPONINI <0.30 <0.30 <0.30   BNP (last 3 results)  Recent Labs  12/18/12 1419  PROBNP 165.6   CBG:  Recent Labs Lab 12/22/12 0106 12/22/12 0142 12/22/12 0435 12/22/12 0751 12/22/12 1159  GLUCAP 65* 90 112* 85 159*    Recent Results (from the past 240 hour(s))  URINE CULTURE     Status: None   Collection Time    12/18/12  3:32 PM      Result Value Range Status   Specimen Description URINE, CLEAN CATCH   Final   Special Requests NONE   Final   Culture  Setup Time     Final   Value: 12/18/2012 20:00     Performed at [facility]   Culture     Final   Value: INSIGNIFICANT GROWTH     Performed at [facility]   Report Status 12/19/2012 FINAL   Final     Studies: No results found.  Scheduled Meds: . cefTRIAXone (ROCEPHIN)  IV  1 g Intravenous Q24H  . diltiazem  120 mg Oral Daily  .  hydrocortisone  10 mg Oral QPC lunch  . hydrocortisone  20 mg Oral Daily  . insulin aspart  0-9 Units Subcutaneous Q4H  . lisinopril  10 mg Oral Daily  . pantoprazole  40 mg Oral BID AC  . QUEtiapine  50 mg Oral QHS  . sodium chloride  3 mL Intravenous Q12H   Continuous Infusions:    Principal Problem:   Duodenal ulcer with hemorrhage Active Problems:   Type II or unspecified type diabetes mellitus without mention of complication, uncontrolled   Lung cancer - followed by Dr. Mardi Mainland at [facility]   Essential hypertension,  benign   Panhypopituitarism   Anemia   Melena   CKD (chronic kidney disease), stage III   Restless legs      Georgina Quint, PA-S [address] [place], [place] [facility] Pager [phone]  If 7PM-7AM, please contact night-coverage at [email or web address], password [facility] 12/22/2012, 12:26 PM  LOS: 4 days   Attending Patient seen and examined, agree with the above assessment and plan. Hb better, no further melena, suspect home in am  Windell Norfolk MD

## 2012-12-22 NOTE — Evaluation (Signed)
Physical Therapy Evaluation Patient Details Name: [name] [name] MRN: [id number] DOB: May 20, 1921 Today's Date: 12/22/2012 Time: 4098-1191 PT Time Calculation (min): 32 min  PT Assessment / Plan / Recommendation History of Present Illness  Adm with SOB; Hgb 6.4 with GI bleed. PMHx includes DM, HTN, afib.  Clinical Impression  Pt admitted with above diagnoses. Pt currently with functional limitations due to the deficits listed below (see PT Problem List). Pt unsafe to ambulate alone at this time, however did well with RW. Anticipate she can become modified independent with use of RW to return home with intermittent assistance from her family. Will see pt 11/21 a.m. to assist with d/c planning. Pt will benefit from skilled PT to increase their independence and safety with mobility to allow discharge to the venue listed below.       PT Assessment  Patient needs continued PT services    Follow Up Recommendations  Home health PT;Supervision - Intermittent    Does the patient have the potential to tolerate intense rehabilitation      Barriers to Discharge Decreased caregiver support children live nearby, however cannot provide 24/7 assist    Equipment Recommendations  Rolling walker with 5" wheels    Recommendations for Other Services OT consult   Frequency Min 3X/week    Precautions / Restrictions Precautions Precautions: Fall   Pertinent Vitals/Pain Denied pain; SaO2 93-96% on RA       Mobility  Bed Mobility Bed Mobility: Rolling Left;Left Sidelying to Sit;Sitting - Scoot to Edge of Bed Rolling Left: 6: Modified independent (Device/Increase time);With rail Left Sidelying to Sit: 6: Modified independent (Device/Increase time);With rails;HOB elevated Sitting - Scoot to Edge of Bed: 5: Supervision Details for Bed Mobility Assistance: pt slightly impulsive; supervision for safety Transfers Transfers: Sit to Stand;Stand to Sit Sit to Stand: 4: Min guard Stand to Sit: 4: Min  guard Details for Transfer Assistance: x 4; pt slightly unsteady without device; impulsive when returning to sit (begins to sit while still turning to align with seat) Ambulation/Gait Ambulation/Gait Assistance: 4: Min assist;4: Min guard Ambulation Distance (Feet): 130 Feet (100, seated rest, 30) Assistive device: Rolling walker;None Ambulation/Gait Assistance Details: initially did not use a device; pt with staggering loss of balance to her Lt requiring assist to recover; instructed in use of RW and pt willing to use it if it will allow her to d/c home alone. Gait Pattern: Step-through pattern;Decreased stride length    Exercises     PT Diagnosis: Difficulty walking  PT Problem List: Decreased activity tolerance;Decreased balance;Decreased mobility;Decreased cognition;Decreased knowledge of use of DME;Decreased safety awareness PT Treatment Interventions: DME instruction;Gait training;Functional mobility training;Therapeutic activities;Balance training;Patient/family education     PT Goals(Current goals can be found in the care plan section) Acute Rehab PT Goals Patient Stated Goal: get her strength and independence back PT Goal Formulation: With patient Time For Goal Achievement: 12/29/12 Potential to Achieve Goals: Good  Visit Information  Last PT Received On: 12/22/12 Assistance Needed: +1 History of Present Illness: Adm with SOB; Hgb 6.4 with GI bleed. PMHx includes DM, HTN, afib.       Prior Functioning  Home Living Family/patient expects to be discharged to:: Private residence Living Arrangements: Alone Available Help at Discharge: Family;Friend(s);Available PRN/intermittently Type of Home: Other(Comment) (condo) Home Access: Level entry Home Layout: One level Home Equipment: None Prior Function Level of Independence: Independent Comments: Pt endorses she was getting weaker for 4-5 days PTA with balance impaired. Reports prior to this illness, she had no significant  balance problems Communication Communication: HOH    Cognition  Cognition Arousal/Alertness: Awake/alert Behavior During Therapy: WFL for tasks assessed/performed Overall Cognitive Status: No family/caregiver present to determine baseline cognitive functioning Memory: Decreased short-term memory    Extremity/Trunk Assessment Upper Extremity Assessment Upper Extremity Assessment: Generalized weakness Lower Extremity Assessment Lower Extremity Assessment: Generalized weakness Cervical / Trunk Assessment Cervical / Trunk Assessment: Normal   Balance Balance Balance Assessed: Yes Static Sitting Balance Static Sitting - Balance Support: No upper extremity supported;Feet supported Static Sitting - Level of Assistance: 5: Stand by assistance Static Standing Balance Static Standing - Balance Support: No upper extremity supported Static Standing - Level of Assistance: 4: Min assist Rhomberg - Eyes Opened: 15 (unable to fully achieve position--keeps feet apart) Rhomberg - Eyes Closed: 3 (significant posterior sway with stepping strategy to maintai)  End of Session PT - End of Session Equipment Utilized During Treatment: Gait belt Activity Tolerance: Patient tolerated treatment well Patient left: in chair;with call bell/phone within reach;with chair alarm set Nurse Communication: Mobility status  GP     Rangel Echeverri 12/22/2012, 3:14 PM Pager [phone]

## 2012-12-22 NOTE — Progress Notes (Signed)
[facility] Progress Note    Since last GI note: Repeat EGD yesterday for recurrent DU bleeding, see report in chart. Visible vessel treated.  Bx's from first EGD showed no sign of H. Pylori. No overt GI bleeding overnight.  Slept well.  Feels pretty well this AM.  Objective: Vital signs in last 24 hours: Temp:  [97.9 F (36.6 C)-99 F (37.2 C)] 99 F (37.2 C) (11/20 0554) Pulse Rate:  [56-97] 97 (11/20 0554) Resp:  [14-24] 20 (11/20 0554) BP: (100-178)/(44-138) 130/71 mmHg (11/20 0554) SpO2:  [96 %-100 %] 97 % (11/20 0554) Weight:  [188 lb 15 oz (85.7 kg)] 188 lb 15 oz (85.7 kg) (11/20 0554) Last BM Date: 12/21/12 General: alert and oriented times 3 Heart: regular rate and rythm Abdomen: soft, non-tender, non-distended, normal bowel sounds   Lab Results:  Recent Labs  12/20/12 0535 12/21/12 0057 12/22/12 0051  WBC 8.8 9.0 11.9*  HGB 8.7* 6.7* 12.0  PLT 161 151 129*  MCV 88.4 89.9 85.9    Recent Labs  12/21/12 0057 12/22/12 0143  NA 139 138  K 4.3 4.8  CL 110 106  CO2 22 22  GLUCOSE 148* 84  BUN 42* 32*  CREATININE 1.53* 1.46*  CALCIUM 7.5* 7.9*    Medications: Scheduled Meds: . cefTRIAXone (ROCEPHIN)  IV  1 g Intravenous Q24H  . diltiazem  120 mg Oral Daily  . hydrocortisone  10 mg Oral QPC lunch  . hydrocortisone  20 mg Oral Daily  . insulin aspart  0-9 Units Subcutaneous Q4H  . lisinopril  10 mg Oral Daily  . pantoprazole (PROTONIX) IV  40 mg Intravenous Q12H  . QUEtiapine  50 mg Oral QHS  . sodium chloride  3 mL Intravenous Q12H   Continuous Infusions:  PRN Meds:.acetaminophen, acetaminophen, alum & mag hydroxide-simeth, hydrALAZINE, HYDROcodone-acetaminophen, methocarbamol, ondansetron (ZOFRAN) IV, ondansetron    Assessment/Plan: 79 y.o. female with bleeding duodenal ulcer, likely nsaid related  H. Pylori neg on biopsy so this is probably due to NSAIDs (was taking 2 alleve about 5 days per week and ASA daily).  Better view of  ulcer yesterday during repeat EGD, visible vessel treated. She should stay on PPI twice daily for now, office visit with me in 4-5 weeks, no nsaids unless absolutely needed. Will advance diet and if she does well she is safe for d/c tomorrow.    [name] Fee, MD  12/22/2012, 7:23 AM [facility] Pager [phone]

## 2012-12-22 NOTE — Care Management Note (Addendum)
    Page 1 of 1   12/24/2012     8:44:24 AM   CARE MANAGEMENT NOTE 12/24/2012  Patient:  [name],[name]   Account Number:  [id number]  Date Initiated:  12/22/2012  Documentation initiated by:  Letha Cape  Subjective/Objective Assessment:   dx duodenal ulcer with hemorrhage  admit- lives alone.     Action/Plan:   pt eval- rec hhpt   Anticipated DC Date:  12/23/2012   Anticipated DC Plan:  HOME W HOME HEALTH SERVICES      DC Planning Services  CM consult      [facility] Choice  HOME HEALTH   Choice offered to / List presented to:  C-4 Adult Children        HH arranged  HH-2 PT      [facility] agency  [facility]   Status of service:  Completed, signed off Medicare Important Message given?   (If response is "NO", the following Medicare IM given date fields will be blank) Date Medicare IM given:   Date Additional Medicare IM given:    Discharge Disposition:  HOME W HOME HEALTH SERVICES  Per UR Regulation:  Reviewed for med. necessity/level of care/duration of stay  If discussed at Long Length of Stay Meetings, dates discussed:    Comments:  12/22/12 16:50 Letha Cape RN, BSN [phone] patient lives alone, she has a MD who makes house calls per her daughter, [name] [name] .  Shelia chose [facility]  for  HHPT for her mother, who told me to ask her daughter.  Referral made to [facility] for  HHPT.  Soc will begin 24-48 hrs post discharge.

## 2012-12-22 NOTE — Progress Notes (Signed)
Hypoglycemic Event  CBG: Results for [name] [name], [name] [name] (MRN [id number]) as of 12/22/2012 01:12  Ref. Range 12/22/2012 01:06  Glucose-Capillary Latest Range: 70-99 mg/dL 65 (L)    Treatment: 15 GM carbohydrate snack  Symptoms: None  Results for [name] [name], [name] [name] (MRN [id number]) as of 12/22/2012 02:10  Ref. Range 12/22/2012 01:42  Glucose-Capillary Latest Range: 70-99 mg/dL 90    Possible Reasons for Event: Unknown  Comments/MD notified: n/a    Carmin Muskrat  Remember to initiate Hypoglycemia Order Set & complete

## 2012-12-23 DIAGNOSIS — G2581 Restless legs syndrome: Secondary | ICD-10-CM

## 2012-12-23 LAB — CBC
HCT: 29.8 % — ABNORMAL LOW (ref 36.0–46.0)
Hemoglobin: 10.4 g/dL — ABNORMAL LOW (ref 12.0–15.0)
MCH: 30.7 pg (ref 26.0–34.0)
Platelets: 129 10*3/uL — ABNORMAL LOW (ref 150–400)
RDW: 16 % — ABNORMAL HIGH (ref 11.5–15.5)
WBC: 6.9 10*3/uL (ref 4.0–10.5)

## 2012-12-23 LAB — GLUCOSE, CAPILLARY
Glucose-Capillary: 94 mg/dL (ref 70–99)
Glucose-Capillary: 129 mg/dL — ABNORMAL HIGH (ref 70–99)

## 2012-12-23 MED ORDER — DOCUSATE SODIUM 100 MG PO CAPS: 100.0000 mg | ORAL_CAPSULE | Freq: Two times a day (BID) | ORAL | Status: AC

## 2012-12-23 MED ORDER — TRAMADOL HCL 50 MG PO TABS: 50.0000 mg | ORAL_TABLET | Freq: Two times a day (BID) | ORAL | Status: AC | PRN

## 2012-12-23 MED ORDER — FERROUS SULFATE 325 (65 FE) MG PO TABS: 325.0000 mg | ORAL_TABLET | Freq: Every day | ORAL | Status: AC

## 2012-12-23 MED ORDER — PANTOPRAZOLE SODIUM 40 MG PO TBEC: 40.0000 mg | DELAYED_RELEASE_TABLET | Freq: Two times a day (BID) | ORAL | Status: AC

## 2012-12-23 NOTE — Discharge Summary (Signed)
Physician Discharge Summary  Madai Nuccio ZOX:[id number] DOB: 08-22-1921 DOA: 12/18/2012  PCP: Florentina Jenny, MD  Admit date: 12/18/2012 Discharge date: 12/23/2012  Time spent: 50 minutes  Recommendations for Outpatient Follow-up:   1. Follow up with PCP within one week to check cbc, bmet,  and determine whether or not she is appropriate for anticoagulation/antiplatelet therapy. 2. Patient will be discharged with home health physical therapy. 3. Patient's family is aware that she is a high fall risk.  They will ensure that she is monitored until her balance is more stable. Home health physical therapy has been arranged.  Discharge Diagnoses:  Principal Problem:   Duodenal ulcer with hemorrhage Active Problems:   Type II or unspecified type diabetes mellitus without mention of complication, uncontrolled   Lung cancer - followed by Dr. Mardi Mainland at [facility]   Essential hypertension, benign   Panhypopituitarism   Anemia   Melena   CKD (chronic kidney disease), stage III   Restless legs   Discharge Condition: stable for discharge home with home PT and 24 monitoring by the family.  Diet recommendation: carbohydrate modified - diabetic diet  Filed Weights   12/21/12 0601 12/22/12 0554 12/23/12 0619  Weight: 80.1 kg (176 lb 9.4 oz) 85.7 kg (188 lb 15 oz) 81.1 kg (178 lb 12.7 oz)    History of present illness:  Baylin Cabal is a 79 y.o. female with past medical history diabetes mellitus, hypertension and atrial fibrillation. She is not on any type of anticoagulation. Patient came in to the hospital because of shortness of breath. She was in her usual state of health until about 5 days ago when she started to have dyspnea on exertion, dyspnea worsened. About the same time she noticed black stools.  Her PCP sent a nurse to check on her stool and it was guaiac-negative. She denies any abdominal pain, denies any vomiting. She also reported chest heaviness with the dyspnea.  In the ED patient  hemoglobin is 6.4, she had a hemoglobin of 13.0 in January of 2014.  Patient admitted to the hospital for further evaluation.   Hospital Course:   Duodenal Ulcer  -Underwent EGD 11/17. Was found to have oozing ulcer in duodenal bulb  -Repeat EGD 11/19 due to continued bleeding and decreased Hbg 6.7. Duodenal bulb ulcer injected and cauterized.  -She recently started taking two Aleve each day (for aches and pains in the morning) and is on an aspirin regimen  -H. Pylori negative on biopsy, so ulcer likely due to NSAID use.  -Continue with PPI BID, no NSAIDS -Diet advanced to heart healthy 11/20. Tolerated well. No further bloody bowel movements this admission after 11/19.  Acute Blood Loss Anemia  -Secondary to upper GI bleed.  -Reported melanotic stools for 5 days before admission.  -Transfused a total of 5 units of PRBCs during this admission. -Had 5 melanotic/bloody stools on 11/18. -EGD 11/19 showed duodenal bulb ulcer. Was injected and cauterized. -Hbg equilibrated to 10.4 today as expected. -Will continue oral iron supplementation/stool softeners as an outpatient.   Type 2 diabetes mellitus  -A1c is 6.8  -Carbohydrate modified diet and insulin sliding scale - sensitive while in hospital. -Resume home medications on discharge   Atrial fibrillation  -Reported history of atrial fibrillation, currently normal sinus rhythm.  -Rate is controlled with Cardizem, on aspirin prior to admission, but this has been discontinued due to bleeding. -Discuss restarting antiplatelet with primary care physician.  Patient currently appears to be a high fall risk.  CKD  -  Creatinine baseline around 1.4, she at baseline now (1.46 on 11/20)   Panhypopituitarism  -Reported on previous note that she is on hydrocortisone 20 mg a.m. and 10 mg in the evening.  -She is on 75 mcg of Synthroid.  -Started on 20 mg of hydrocortisone PO daily per pharmacy. -Resume home medications on discharge  History of  lung cancer  -Status post radiation.   UTI  -Urinalysis is showing questionable UTI with leukocytes and bacteria.  -Patient reported urinary urgency, and increased frequency, and sharp pains before urination that are relieved with urination.  -Treated empirically with Rocephin for 5 days. No growth likely due to treatment with Augmentin starting 11/13.  -Patient says that urinary symptoms have resolved.   Restless Legs  -New complaint of restless legs began over last several weeks.  -serum iron and ferritin are WNL  -Added Robaxin q 8 PRN while inpatient.  Will d/c upon discharge.  Hypertension  -Was been high inpatient after administration of epinephrine during EGD  -Restarted home medications 11/17 with hold parameters, hydralazine as needed for SBP of more than 160.  -Patient's blood pressure is stable and has been controlled for 48 hours prior to discharge.  Deconditioning weakness  -likely due to age / hospital stay / and acute illness  -PT recommends home health PT with Rolling walker. -Talked with Regan Rakers on the morning of discharge.  Explained that his mother will need 24 monitor until she is stronger.  -She is a fall risk.  -Home Health PT and home safety evaluation were ordered at discharge.  Procedures: EGD, 11/17  EGD, 11/19  Consultations:  Gastroenterology  Discharge Exam: Filed Vitals:   12/23/12 0619  BP: 112/72  Pulse: 58  Temp: 98.4 F (36.9 C)  Resp: 20    General: NAD, alert and oriented x3, hard of hearing.  Cardiovascular: RRR, no murmur, gallop, rubs.  Respiratory: CTAB, no rales, crackles, rhonchi Abdomen: +BS, no masses, no distension, no tenderness to palpation. Skin: bruising noted on left forearm and right upper arm due to multiple needle sticks.  Discharge Instructions      Discharge Orders   Future Orders Complete By Expires   Call MD for:  As directed    Comments:     Blood in stool or dark/tarry stool, or feeling light  headed or dizzy.   Diet - low sodium heart healthy  As directed    Increase activity slowly  As directed    Other Restrictions  As directed    Comments:     Do not ambulate without walker.       Medication List    STOP taking these medications       amLODipine 5 MG tablet  Commonly known as:  NORVASC     amoxicillin-clavulanate 500-125 MG per tablet  Commonly known as:  AUGMENTIN     diltiazem 120 MG 24 hr capsule  Commonly known as:  TIAZAC     Fiber 625 MG Tabs     HYDROcodone-acetaminophen 5-500 MG per tablet  Commonly known as:  VICODIN     nitrofurantoin (macrocrystal-monohydrate) 100 MG capsule  Commonly known as:  MACROBID     omeprazole 40 MG capsule  Commonly known as:  PRILOSEC      TAKE these medications       atorvastatin 10 MG tablet  Commonly known as:  LIPITOR  TAKE 1 TABLET AT BEDTIME FOR CHOLESTEROL     diltiazem 120 MG 24 hr capsule  Commonly  known as:  CARDIZEM CD  TAKE ONE CAPSULE BY MOUTH EVERY DAY     docusate sodium 100 MG capsule  Commonly known as:  COLACE  Take 1 capsule (100 mg total) by mouth 2 (two) times daily. Take this stool softener twice daily particularly when taking IRON or narcotic pain medications.  Do no take if you are having diarrhea.     ferrous sulfate 325 (65 FE) MG tablet  Take 1 tablet (325 mg total) by mouth daily.     glipiZIDE 5 MG 24 hr tablet  Commonly known as:  GLUCOTROL XL  TAKE 1 TABLET EVERY DAY     guaiFENesin 600 MG 12 hr tablet  Commonly known as:  MUCINEX  Take 600 mg by mouth 2 (two) times daily.     levothyroxine 75 MCG tablet  Commonly known as:  SYNTHROID, LEVOTHROID  Take 75 mcg by mouth daily.     lisinopril 40 MG tablet  Commonly known as:  PRINIVIL,ZESTRIL  Take 40 mg by mouth daily.     loratadine 10 MG tablet  Commonly known as:  CLARITIN  Take 10 mg by mouth daily.     pantoprazole 40 MG tablet  Commonly known as:  PROTONIX  Take 1 tablet (40 mg total) by mouth 2 (two) times  daily before a meal.     QUEtiapine 50 MG tablet  Commonly known as:  SEROQUEL  Take 1 tablet (50 mg total) by mouth at bedtime.     traMADol 50 MG tablet  Commonly known as:  ULTRAM  Take 1 tablet (50 mg total) by mouth 2 (two) times daily as needed for moderate pain.       Allergies  Allergen Reactions  . Ciprofloxacin Hcl     unknown  . Codeine     unknown  . Sulfa Antibiotics     unknown   Follow-up Information   Follow up with Florentina Jenny, MD. Schedule an appointment as soon as possible for a visit in 1 week.   Specialty:  Family Medicine   Contact information:   [address] [place] [address] [phone]       Follow up with Rachael Fee, MD. Schedule an appointment as soon as possible for a visit in 4 weeks.   Specialty:  Gastroenterology   Contact information:   [address] [place] [address] [phone]        The results of significant diagnostics from this hospitalization (including imaging, microbiology, ancillary and laboratory) are listed below for reference.    Significant Diagnostic Studies: Dg Chest 2 View  12/18/2012   CLINICAL DATA:  Chest pain and shortness of breath. Cough and congestion. History of lung cancer, hypertension, and diabetes.  EXAM: CHEST  2 VIEW  COMPARISON:  None.  FINDINGS: Mild wedging of a mid thoracic vertebral body. Indeterminate acuity.  Degraded portable AP view. Patient rotated to the right. Cardiomegaly. Tortuous thoracic aorta. No pleural effusion or pneumothorax. No congestive failure. Mild volume loss at the left lung base. Aortic atherosclerosis.  IMPRESSION: Cardiomegaly and low lung volumes, without acute disease.  Mid thoracic compression deformity, mild.   Electronically Signed   By: Jeronimo Greaves M.D.   On: 12/18/2012 13:56   Dg Abd Portable 1v  12/19/2012   CLINICAL DATA:  Abdominal pain post EGD.  Question free air.  EXAM: PORTABLE ABDOMEN - 1 VIEW  COMPARISON:  None.   FINDINGS: A single left lateral decubitus view  of the abdomen shows no definite free air. Gas is seen in mildly prominent small bowel and colon.  IMPRESSION: 1. No definite free air. 2. Gaseous distention of bowel after EGD.   Electronically Signed   By: Leanna Battles M.D.   On: 12/19/2012 12:39    Microbiology: Recent Results (from the past 240 hour(s))  URINE CULTURE     Status: None   Collection Time    12/18/12  3:32 PM      Result Value Range Status   Specimen Description URINE, CLEAN CATCH   Final   Special Requests NONE   Final   Culture  Setup Time     Final   Value: 12/18/2012 20:00     Performed at [facility]   Culture     Final   Value: INSIGNIFICANT GROWTH     Performed at [facility]   Report Status 12/19/2012 FINAL   Final     Labs: Basic Metabolic Panel:  Recent Labs Lab 12/18/12 1419 12/19/12 0640 12/21/12 0057 12/22/12 0143  NA 137 142 139 138  K 4.0 4.6 4.3 4.8  CL 105 110 110 106  CO2 20 23 22 22   GLUCOSE 202* 67* 148* 84  BUN 71* 56* 42* 32*  CREATININE 1.49* 1.42* 1.53* 1.46*  CALCIUM 8.7 8.8 7.5* 7.9*   Liver Function Tests:  Recent Labs Lab 12/18/12 1419  AST 17  ALT 13  ALKPHOS 28*  BILITOT 0.1*  PROT 5.6*  ALBUMIN 3.1*   CBC:  Recent Labs Lab 12/18/12 1419  12/19/12 2000 12/20/12 0535 12/21/12 0057 12/22/12 0051 12/23/12 0617  WBC 12.6*  < > 9.6 8.8 9.0 11.9* 6.9  NEUTROABS 10.4*  --   --   --   --   --   --   HGB 6.4*  < > 9.6* 8.7* 6.7* 12.0 10.4*  HCT 18.9*  < > 27.5* 25.8* 19.6* 34.6* 29.8*  MCV 90.9  < > 87.6 88.4 89.9 85.9 87.9  PLT 176  < > 183 161 151 129* 129*  < > = values in this interval not displayed. Cardiac Enzymes:  Recent Labs Lab 12/18/12 1700 12/18/12 2221 12/19/12 0640  TROPONINI <0.30 <0.30 <0.30   BNP: BNP (last 3 results)  Recent Labs  12/18/12 1419  PROBNP 165.6   CBG:  Recent Labs Lab 12/22/12 0751 12/22/12 1159 12/22/12 1620 12/22/12 2342 12/23/12 0806   GLUCAP 85 159* 255* 222* 94    Signed:  Georgina Quint, PA-S Algis Downs, PA-C [facility] 12/23/2012, 11:31 AM  Attending - Patient seen and examined, and agree with above assessment and plan. No further bleeding, hemoglobin stable at 10.4. She is stable to be discharged safely.  Windell Norfolk MD

## 2012-12-23 NOTE — Progress Notes (Signed)
Physical Therapy Treatment Patient Details Name: [name] [name] MRN: [id number] DOB: [birth year] Today's Date: 12/23/2012 Time: 2130-8657 PT Time Calculation (min): 14 min  PT Assessment / Plan / Recommendation  History of Present Illness Adm with SOB; Hgb 6.4 with GI bleed. PMHx includes DM, HTN, afib.   PT Comments   Pt able to increase ambulation distance with RW and min guard for safety and to steady today. Pt educated on importance of safe technique with gt and encouraged to have all throw rugs put away prior to returning home in order to safely ambulate with RW. Pt may also benefit from life alert system due to living independently; pt verbalized understanding and is going to speak with her caregiver. Pt will need (A) for safety upon acute D/C. Pt does believe she is ambulating at her baseline at this time.  Continue to recommend HHPT.   Follow Up Recommendations  Home health PT;Supervision - Intermittent     Does the patient have the potential to tolerate intense rehabilitation     Barriers to Discharge        Equipment Recommendations  Rolling walker with 5" wheels    Recommendations for Other Services OT consult  Frequency Min 3X/week   Progress towards PT Goals Progress towards PT goals: Progressing toward goals  Plan Current plan remains appropriate    Precautions / Restrictions Precautions Precautions: Fall Restrictions Weight Bearing Restrictions: No   Pertinent Vitals/Pain Did not c/o pain during session.    Mobility  Bed Mobility Bed Mobility: Left Sidelying to Sit;Sitting - Scoot to Edge of Bed;Sit to Supine Left Sidelying to Sit: 7: Independent;HOB flat Sitting - Scoot to Edge of Bed: 7: Independent Sit to Supine: 6: Modified independent (Device/Increase time);HOB flat Details for Bed Mobility Assistance: pt encouraged to perform bed mobility without use of handrails to simulate home enviroment; required increased time to lift legs back onto bed and into supine  position; no physical (A) or cues needed Transfers Transfers: Sit to Stand;Stand to Sit Sit to Stand: 5: Supervision;From bed Stand to Sit: 5: Supervision;To bed Details for Transfer Assistance: supervision for cues for safe hand placement and management of RW: pt can be impulsive at times; cues to slow down and focuse on safe strategies; pt encouraged to keep RW with her at all times  Ambulation/Gait Ambulation/Gait Assistance: 4: Min guard Ambulation Distance (Feet): 200 Feet Assistive device: Rolling walker Ambulation/Gait Assistance Details: cues for sequencing and management of RW for safest technique; min guard to steady; pt continues to be slightly unsteady and impuslive with gt; cues to slow down and focus on safest technique  Gait Pattern: Step-through pattern;Decreased stride length Gait velocity: decreased Stairs: No Wheelchair Mobility Wheelchair Mobility: No         PT Diagnosis:    PT Problem List:   PT Treatment Interventions:     PT Goals (current goals can now be found in the care plan section) Acute Rehab PT Goals Patient Stated Goal: get her strength and independence back PT Goal Formulation: With patient Time For Goal Achievement: 12/29/12 Potential to Achieve Goals: Good  Visit Information  Last PT Received On: 12/23/12 Assistance Needed: +1 History of Present Illness: Adm with SOB; Hgb 6.4 with GI bleed. PMHx includes DM, HTN, afib.    Subjective Data  Subjective: pt lying supine; agreeable to therapy. " we can walk. i know i need to"  Patient Stated Goal: get her strength and independence back   Cognition  Cognition Arousal/Alertness: Awake/alert Behavior  During Therapy: WFL for tasks assessed/performed Overall Cognitive Status: No family/caregiver present to determine baseline cognitive functioning Memory: Decreased short-term memory    Balance  Balance Balance Assessed: Yes Static Sitting Balance Static Sitting - Balance Support: No upper  extremity supported;Feet supported Static Sitting - Level of Assistance: 7: Independent Static Standing Balance Static Standing - Balance Support: Bilateral upper extremity supported;During functional activity Static Standing - Level of Assistance: Other (comment) (min guard)  End of Session PT - End of Session Equipment Utilized During Treatment: Gait belt Activity Tolerance: Patient tolerated treatment well Patient left: in bed;with call bell/phone within reach;with bed alarm set Nurse Communication: Mobility status   GP     Donell Sievert, [place] [phone] 12/23/2012, 11:41 AM

## 2012-12-23 NOTE — Progress Notes (Signed)
[name] [name] to be D/C'd Home per MD order.  Discussed with the patient and granddaughter [name] and all questions fully answered.    Medication List    STOP taking these medications       amLODipine 5 MG tablet  Commonly known as:  NORVASC     amoxicillin-clavulanate 500-125 MG per tablet  Commonly known as:  AUGMENTIN     diltiazem 120 MG 24 hr capsule  Commonly known as:  TIAZAC     Fiber 625 MG Tabs     HYDROcodone-acetaminophen 5-500 MG per tablet  Commonly known as:  VICODIN     nitrofurantoin (macrocrystal-monohydrate) 100 MG capsule  Commonly known as:  MACROBID     omeprazole 40 MG capsule  Commonly known as:  PRILOSEC      TAKE these medications       atorvastatin 10 MG tablet  Commonly known as:  LIPITOR  TAKE 1 TABLET AT BEDTIME FOR CHOLESTEROL     diltiazem 120 MG 24 hr capsule  Commonly known as:  CARDIZEM CD  TAKE ONE CAPSULE BY MOUTH EVERY DAY     docusate sodium 100 MG capsule  Commonly known as:  COLACE  Take 1 capsule (100 mg total) by mouth 2 (two) times daily. Take this stool softener twice daily particularly when taking IRON or narcotic pain medications.  Do no take if you are having diarrhea.     ferrous sulfate 325 (65 FE) MG tablet  Take 1 tablet (325 mg total) by mouth daily.     glipiZIDE 5 MG 24 hr tablet  Commonly known as:  GLUCOTROL XL  TAKE 1 TABLET EVERY DAY     guaiFENesin 600 MG 12 hr tablet  Commonly known as:  MUCINEX  Take 600 mg by mouth 2 (two) times daily.     levothyroxine 75 MCG tablet  Commonly known as:  SYNTHROID, LEVOTHROID  Take 75 mcg by mouth daily.     lisinopril 40 MG tablet  Commonly known as:  PRINIVIL,ZESTRIL  Take 40 mg by mouth daily.     loratadine 10 MG tablet  Commonly known as:  CLARITIN  Take 10 mg by mouth daily.     pantoprazole 40 MG tablet  Commonly known as:  PROTONIX  Take 1 tablet (40 mg total) by mouth 2 (two) times daily before a meal.     QUEtiapine 50 MG tablet  Commonly  known as:  SEROQUEL  Take 1 tablet (50 mg total) by mouth at bedtime.     traMADol 50 MG tablet  Commonly known as:  ULTRAM  Take 1 tablet (50 mg total) by mouth 2 (two) times daily as needed for moderate pain.        VVS, Skin clean, dry and intact without evidence of skin break down, no evidence of skin tears noted. IV catheter discontinued intact. Site without signs and symptoms of complications. Dressing and pressure applied.  An After Visit Summary was printed and given to the patient. Patient escorted via WC, and D/C home via private auto.  Laurel Dimmer 12/23/2012 1:37 PM

## 2012-12-27 ENCOUNTER — Other Ambulatory Visit: Payer: Self-pay | Admitting: Family Medicine

## 2013-04-09 ENCOUNTER — Emergency Department (HOSPITAL_BASED_OUTPATIENT_CLINIC_OR_DEPARTMENT_OTHER)
Admission: EM | Admit: 2013-04-09 | Discharge: 2013-04-09 | Disposition: A | Discharge status: Home / Self Care | Payer: Medicare Other | Attending: Emergency Medicine | Admitting: Emergency Medicine

## 2013-04-09 ENCOUNTER — Emergency Department (HOSPITAL_BASED_OUTPATIENT_CLINIC_OR_DEPARTMENT_OTHER): Payer: Medicare Other

## 2013-04-09 ENCOUNTER — Encounter (HOSPITAL_BASED_OUTPATIENT_CLINIC_OR_DEPARTMENT_OTHER): Payer: Self-pay | Admitting: Emergency Medicine

## 2013-04-09 DIAGNOSIS — IMO0002 Reserved for concepts with insufficient information to code with codable children: Secondary | ICD-10-CM | POA: Insufficient documentation

## 2013-04-09 DIAGNOSIS — Z87440 Personal history of urinary (tract) infections: Secondary | ICD-10-CM | POA: Insufficient documentation

## 2013-04-09 DIAGNOSIS — I10 Essential (primary) hypertension: Secondary | ICD-10-CM | POA: Insufficient documentation

## 2013-04-09 DIAGNOSIS — Z87448 Personal history of other diseases of urinary system: Secondary | ICD-10-CM | POA: Insufficient documentation

## 2013-04-09 DIAGNOSIS — Z9889 Other specified postprocedural states: Secondary | ICD-10-CM | POA: Insufficient documentation

## 2013-04-09 DIAGNOSIS — M25551 Pain in right hip: Secondary | ICD-10-CM

## 2013-04-09 DIAGNOSIS — E079 Disorder of thyroid, unspecified: Secondary | ICD-10-CM | POA: Insufficient documentation

## 2013-04-09 DIAGNOSIS — S99919A Unspecified injury of unspecified ankle, initial encounter: Secondary | ICD-10-CM

## 2013-04-09 DIAGNOSIS — S79929A Unspecified injury of unspecified thigh, initial encounter: Principal | ICD-10-CM

## 2013-04-09 DIAGNOSIS — E1165 Type 2 diabetes mellitus with hyperglycemia: Secondary | ICD-10-CM

## 2013-04-09 DIAGNOSIS — Z8739 Personal history of other diseases of the musculoskeletal system and connective tissue: Secondary | ICD-10-CM | POA: Insufficient documentation

## 2013-04-09 DIAGNOSIS — I4891 Unspecified atrial fibrillation: Secondary | ICD-10-CM | POA: Insufficient documentation

## 2013-04-09 DIAGNOSIS — Z8709 Personal history of other diseases of the respiratory system: Secondary | ICD-10-CM | POA: Insufficient documentation

## 2013-04-09 DIAGNOSIS — Z85118 Personal history of other malignant neoplasm of bronchus and lung: Secondary | ICD-10-CM | POA: Insufficient documentation

## 2013-04-09 DIAGNOSIS — R609 Edema, unspecified: Secondary | ICD-10-CM | POA: Insufficient documentation

## 2013-04-09 DIAGNOSIS — Y92009 Unspecified place in unspecified non-institutional (private) residence as the place of occurrence of the external cause: Secondary | ICD-10-CM | POA: Insufficient documentation

## 2013-04-09 DIAGNOSIS — S8990XA Unspecified injury of unspecified lower leg, initial encounter: Secondary | ICD-10-CM | POA: Insufficient documentation

## 2013-04-09 DIAGNOSIS — R296 Repeated falls: Secondary | ICD-10-CM | POA: Insufficient documentation

## 2013-04-09 DIAGNOSIS — S79919A Unspecified injury of unspecified hip, initial encounter: Secondary | ICD-10-CM | POA: Insufficient documentation

## 2013-04-09 DIAGNOSIS — M549 Dorsalgia, unspecified: Secondary | ICD-10-CM

## 2013-04-09 DIAGNOSIS — Z79899 Other long term (current) drug therapy: Secondary | ICD-10-CM | POA: Insufficient documentation

## 2013-04-09 DIAGNOSIS — Y9389 Activity, other specified: Secondary | ICD-10-CM | POA: Insufficient documentation

## 2013-04-09 DIAGNOSIS — E7800 Pure hypercholesterolemia, unspecified: Secondary | ICD-10-CM | POA: Insufficient documentation

## 2013-04-09 DIAGNOSIS — IMO0001 Reserved for inherently not codable concepts without codable children: Secondary | ICD-10-CM | POA: Insufficient documentation

## 2013-04-09 DIAGNOSIS — S99929A Unspecified injury of unspecified foot, initial encounter: Secondary | ICD-10-CM

## 2013-04-09 DIAGNOSIS — W19XXXA Unspecified fall, initial encounter: Secondary | ICD-10-CM

## 2013-04-09 DIAGNOSIS — K219 Gastro-esophageal reflux disease without esophagitis: Secondary | ICD-10-CM | POA: Insufficient documentation

## 2013-04-09 MED ORDER — TRAMADOL HCL 50 MG PO TABS
50.0000 mg | ORAL_TABLET | Freq: Once | ORAL | Status: AC
Start: 2013-04-09 — End: 2013-04-09
  Administered 2013-04-09: 50 mg via ORAL
  Filled 2013-04-09: qty 1

## 2013-04-09 MED ORDER — TRAMADOL HCL 50 MG PO TABS: 50.0000 mg | ORAL_TABLET | Freq: Four times a day (QID) | ORAL | Status: AC | PRN

## 2013-04-09 NOTE — ED Notes (Signed)
Patient states that she fell at home in her room. Hit her head against the mattress and then landed on her right butt cheek.

## 2013-04-09 NOTE — Discharge Instructions (Signed)
X-rays of your back and both hips were negative CT scan of the right hip negative for any bony injury. Suspect soft tissue muscular injuries. Would expect you to get better over the next few days. Take tramadol as needed. Followup with your doctor for any newer worse symptoms or if not improving.

## 2013-04-09 NOTE — ED Provider Notes (Signed)
CSN: [id number]     Arrival date & time 04/09/13  1658 History   This chart was scribed for Mervin Kung, MD by Era Bumpers, ED scribe. This patient was seen in room MH12/MH12 and the patient's care was started at 1658.  Chief Complaint  Patient presents with  . Fall   Patient is a 80 y.o. female presenting with fall. The history is provided by the patient. No language interpreter was used.  Fall This is a new problem. The current episode started 12 to 24 hours ago. The problem has not changed since onset.Pertinent negatives include no chest pain, no abdominal pain, no headaches and no shortness of breath. The symptoms are aggravated by walking. Nothing relieves the symptoms. She has tried nothing for the symptoms.   HPI Comments: [name] [name] is a 80 y.o. female who presents to the Emergency Department complaining of right hip and right buttocks pain after a fall last PM, 18 hours ago after she lost her footing in her room at home while using her walker, no LOC. She reports pain w/ROM of her right hip and having painful ambulation. She reports lower back pain (worse than baseline) and right posterior knee pain. She reports baseline dysuria. She denies any associated HA, neck pain, sob, cp or abdominal pain. She also denies cold/cough, URI, visual changes lower leg swelling or hemophilia. Hx of anemia from stomach Ulcer, GI bleed in which she was hospitalized a few months ago.  Her PCP is Dr. [facility].  Past Medical History  Diagnosis Date  . Diabetes mellitus without complication   . Hypertension   . Atrial fibrillation   . GERD (gastroesophageal reflux disease)   . 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 Arthritis   . Lung cancer   . Chronic bronchitis   . Heart fibrillation   . Hypertension   . High cholesterol   . Kidney disease   . Thyroid disease   . UTI (urinary tract infection)   . 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 Type II or  unspecified type diabetes mellitus without mention of complication, uncontrolled 05/16/2012   Past Surgical History  Procedure Laterality Date  . Joint replacement    . Knee surgery    . Esophagogastroduodenoscopy N/A 12/19/2012    Procedure: ESOPHAGOGASTRODUODENOSCOPY (EGD);  Surgeon: Milus Banister, MD;  Location: [place];  Service: Endoscopy;  Laterality: N/A;  . Esophagogastroduodenoscopy N/A 12/21/2012    Procedure: ESOPHAGOGASTRODUODENOSCOPY (EGD);  Surgeon: Milus Banister, MD;  Location: [place];  Service: Endoscopy;  Laterality: N/A;   Family History  Problem Relation Age of Onset  . Alcohol abuse      parent  . Arthritis      parent  . Lung cancer    . High Cholesterol    . Heart disease    . Hypertension    . Emotional abuse    . Diabetes     History  Substance Use Topics  . Smoking status: Never Smoker   . Smokeless tobacco: Not on file  . Alcohol Use: No   OB History   Grav Para Term Preterm Abortions TAB SAB Ect Mult Living                 Review of Systems  Constitutional: Negative for chills and fatigue.  HENT: Negative for rhinorrhea and sore throat.   Eyes: Negative for visual disturbance.  Respiratory: Negative for cough and shortness of breath.  Cardiovascular: Negative for chest pain and leg swelling.  Gastrointestinal: Negative for nausea, vomiting, abdominal pain and diarrhea.  Musculoskeletal: Negative for back pain.       Right hip and right buttocks pain  Skin: Negative for rash.  Neurological: Negative for headaches.  Hematological: Does not bruise/bleed easily.  Psychiatric/Behavioral: Negative for confusion.  All other systems reviewed and are negative.   Allergies  Ciprofloxacin hcl; Codeine; and Sulfa antibiotics  Home Medications   Current Outpatient Rx  Name  Route  Sig  Dispense  Refill  . omeprazole (PRILOSEC) 10 MG capsule   Oral   Take 10 mg by mouth daily.         Marland Kitchen atorvastatin (LIPITOR) 10 MG tablet       TAKE 1 TABLET AT BEDTIME FOR CHOLESTEROL   90 tablet   2   . diltiazem (CARDIZEM CD) 120 MG 24 hr capsule      TAKE ONE CAPSULE BY MOUTH EVERY DAY   90 capsule   3   . docusate sodium (COLACE) 100 MG capsule   Oral   Take 1 capsule (100 mg total) by mouth 2 (two) times daily. Take this stool softener twice daily particularly when taking IRON or narcotic pain medications.  Do no take if you are having diarrhea.   10 capsule   0   . ferrous sulfate 325 (65 FE) MG tablet   Oral   Take 1 tablet (325 mg total) by mouth daily.   30 tablet   3   . glipiZIDE (GLUCOTROL XL) 5 MG 24 hr tablet      TAKE 1 TABLET EVERY DAY   90 tablet   0   . guaiFENesin (MUCINEX) 600 MG 12 hr tablet   Oral   Take 600 mg by mouth 2 (two) times daily.         Marland Kitchen levothyroxine (SYNTHROID, LEVOTHROID) 75 MCG tablet   Oral   Take 75 mcg by mouth daily.         Marland Kitchen lisinopril (PRINIVIL,ZESTRIL) 40 MG tablet   Oral   Take 40 mg by mouth daily.         Marland Kitchen loratadine (CLARITIN) 10 MG tablet   Oral   Take 10 mg by mouth daily.         . pantoprazole (PROTONIX) 40 MG tablet   Oral   Take 1 tablet (40 mg total) by mouth 2 (two) times daily before a meal.   60 tablet   0   . QUEtiapine (SEROQUEL) 50 MG tablet   Oral   Take 1 tablet (50 mg total) by mouth at bedtime.   90 tablet   3   . traMADol (ULTRAM) 50 MG tablet   Oral   Take 1 tablet (50 mg total) by mouth 2 (two) times daily as needed for moderate pain.   30 tablet   0   . traMADol (ULTRAM) 50 MG tablet   Oral   Take 1 tablet (50 mg total) by mouth every 6 (six) hours as needed.   15 tablet   0    Triage Vitals: BP 164/61  Pulse 67  Temp(Src) 98.3 F (36.8 C) (Oral)  Resp 20  Ht 5\' 2"  (1.575 m)  Wt 180 lb (81.647 kg)  BMI 32.91 kg/m2  SpO2 98%  Physical Exam  Nursing note and vitals reviewed. Constitutional: She is oriented to person, place, and time. She appears well-developed and well-nourished. No distress.   HENT:  Head: Normocephalic and atraumatic.  Eyes: Conjunctivae and EOM are normal. Right eye exhibits no discharge. Left eye exhibits no discharge.  Neck: Normal range of motion.  Cardiovascular: Normal rate, regular rhythm and normal heart sounds.   No murmur heard. Pulmonary/Chest: Effort normal and breath sounds normal. No respiratory distress.  Abdominal: Soft. Bowel sounds are normal. She exhibits no distension. There is no tenderness.  Musculoskeletal: Normal range of motion. She exhibits edema.  Trace edema to both legs  Neurological: She is alert and oriented to person, place, and time. No cranial nerve deficit.  NV grossly intact. Moves all BUE and BLE w/no neurological deficits.   Skin: Skin is warm and dry.  Psychiatric: She has a normal mood and affect. Thought content normal.    ED Course  Procedures (including critical care time) DIAGNOSTIC STUDIES: Oxygen Saturation is 98% on room air, normal by my interpretation.    COORDINATION OF CARE: At 710 PM Discussed treatment plan with patient which includes lumbar spine and bilateral hip/pelvis X-rays. Patient agrees.   Medications  traMADol (ULTRAM) tablet 50 mg (50 mg Oral Given 04/09/13 1932)   Results for orders placed during the hospital encounter of 12/18/12  URINE CULTURE      Result Value Ref Range   Specimen Description URINE, CLEAN CATCH     Special Requests NONE     Culture  Setup Time       Value: 12/18/2012 20:00     Performed at [facility]   Culture       Value: INSIGNIFICANT GROWTH     Performed at [facility]   Report Status 12/19/2012 FINAL    CBC WITH DIFFERENTIAL      Result Value Ref Range   WBC 12.6 (*) 4.0 - 10.5 K/uL   RBC 2.08 (*) 3.87 - 5.11 MIL/uL   Hemoglobin 6.4 (*) 12.0 - 15.0 g/dL   HCT 18.9 (*) 36.0 - 46.0 %   MCV 90.9  78.0 - 100.0 fL   MCH 30.8  26.0 - 34.0 pg   MCHC 33.9  30.0 - 36.0 g/dL   RDW 16.0 (*) 11.5 - 15.5 %   Platelets 176  150 - 400 K/uL    Neutrophils Relative % 82 (*) 43 - 77 %   Neutro Abs 10.4 (*) 1.7 - 7.7 K/uL   Lymphocytes Relative 15  12 - 46 %   Lymphs Abs 1.9  0.7 - 4.0 K/uL   Monocytes Relative 2 (*) 3 - 12 %   Monocytes Absolute 0.3  0.1 - 1.0 K/uL   Eosinophils Relative 1  0 - 5 %   Eosinophils Absolute 0.1  0.0 - 0.7 K/uL   Basophils Relative 0  0 - 1 %   Basophils Absolute 0.0  0.0 - 0.1 K/uL  COMPREHENSIVE METABOLIC PANEL      Result Value Ref Range   Sodium 137  135 - 145 mEq/L   Potassium 4.0  3.5 - 5.1 mEq/L   Chloride 105  96 - 112 mEq/L   CO2 20  19 - 32 mEq/L   Glucose, Bld 202 (*) 70 - 99 mg/dL   BUN 71 (*) 6 - 23 mg/dL   Creatinine, Ser 1.49 (*) 0.50 - 1.10 mg/dL   Calcium 8.7  8.4 - 10.5 mg/dL   Total Protein 5.6 (*) 6.0 - 8.3 g/dL   Albumin 3.1 (*) 3.5 - 5.2 g/dL   AST 17  0 - 37 U/L   ALT  13  0 - 35 U/L   Alkaline Phosphatase 28 (*) 39 - 117 U/L   Total Bilirubin 0.1 (*) 0.3 - 1.2 mg/dL   GFR calc non Af Amer 30 (*) >90 mL/min   GFR calc Af Amer 35 (*) >90 mL/min  PRO B NATRIURETIC PEPTIDE      Result Value Ref Range   Pro B Natriuretic peptide (BNP) 165.6  0 - 450 pg/mL  URINALYSIS, ROUTINE W REFLEX MICROSCOPIC      Result Value Ref Range   Color, Urine YELLOW  YELLOW   APPearance CLOUDY (*) CLEAR   Specific Gravity, Urine 1.018  1.005 - 1.030   pH 5.0  5.0 - 8.0   Glucose, UA NEGATIVE  NEGATIVE mg/dL   Hgb urine dipstick LARGE (*) NEGATIVE   Bilirubin Urine NEGATIVE  NEGATIVE   Ketones, ur NEGATIVE  NEGATIVE mg/dL   Protein, ur NEGATIVE  NEGATIVE mg/dL   Urobilinogen, UA 0.2  0.0 - 1.0 mg/dL   Nitrite NEGATIVE  NEGATIVE   Leukocytes, UA LARGE (*) NEGATIVE  PROTIME-INR      Result Value Ref Range   Prothrombin Time 14.3  11.6 - 15.2 seconds   INR 1.13  0.00 - 1.49  APTT      Result Value Ref Range   aPTT 28  24 - 37 seconds  URINE MICROSCOPIC-ADD ON      Result Value Ref Range   Squamous Epithelial / LPF RARE  RARE   WBC, UA 11-20  <3 WBC/hpf   RBC / HPF 3-6  <3 RBC/hpf    Bacteria, UA FEW (*) RARE  TSH      Result Value Ref Range   TSH 0.658  0.350 - 4.500 uIU/mL  TROPONIN I      Result Value Ref Range   Troponin I <0.30  <0.30 ng/mL  TROPONIN I      Result Value Ref Range   Troponin I <0.30  <0.30 ng/mL  TROPONIN I      Result Value Ref Range   Troponin I <0.30  <0.30 ng/mL  HEMOGLOBIN A1C      Result Value Ref Range   Hemoglobin A1C 6.8 (*) <5.7 %   Mean Plasma Glucose 148 (*) <117 mg/dL  BASIC METABOLIC PANEL      Result Value Ref Range   Sodium 142  135 - 145 mEq/L   Potassium 4.6  3.5 - 5.1 mEq/L   Chloride 110  96 - 112 mEq/L   CO2 23  19 - 32 mEq/L   Glucose, Bld 67 (*) 70 - 99 mg/dL   BUN 56 (*) 6 - 23 mg/dL   Creatinine, Ser 1.42 (*) 0.50 - 1.10 mg/dL   Calcium 8.8  8.4 - 10.5 mg/dL   GFR calc non Af Amer 32 (*) >90 mL/min   GFR calc Af Amer 37 (*) >90 mL/min  CBC      Result Value Ref Range   WBC 9.3  4.0 - 10.5 K/uL   RBC 3.04 (*) 3.87 - 5.11 MIL/uL   Hemoglobin 9.3 (*) 12.0 - 15.0 g/dL   HCT 26.3 (*) 36.0 - 46.0 %   MCV 86.5  78.0 - 100.0 fL   MCH 30.6  26.0 - 34.0 pg   MCHC 35.4  30.0 - 36.0 g/dL   RDW 15.5  11.5 - 15.5 %   Platelets 155  150 - 400 K/uL  GLUCOSE, CAPILLARY      Result Value Ref Range  Glucose-Capillary 199 (*) 70 - 99 mg/dL   Comment 1 Documented in Chart     Comment 2 Notify RN    GLUCOSE, CAPILLARY      Result Value Ref Range   Glucose-Capillary 168 (*) 70 - 99 mg/dL   Comment 1 Notify RN    CBC      Result Value Ref Range   WBC 9.5  4.0 - 10.5 K/uL   RBC 3.11 (*) 3.87 - 5.11 MIL/uL   Hemoglobin 9.5 (*) 12.0 - 15.0 g/dL   HCT 27.1 (*) 36.0 - 46.0 %   MCV 87.1  78.0 - 100.0 fL   MCH 30.5  26.0 - 34.0 pg   MCHC 35.1  30.0 - 36.0 g/dL   RDW 15.9 (*) 11.5 - 15.5 %   Platelets 169  150 - 400 K/uL  CBC      Result Value Ref Range   WBC 9.6  4.0 - 10.5 K/uL   RBC 3.14 (*) 3.87 - 5.11 MIL/uL   Hemoglobin 9.6 (*) 12.0 - 15.0 g/dL   HCT 27.5 (*) 36.0 - 46.0 %   MCV 87.6  78.0 - 100.0 fL   MCH  30.6  26.0 - 34.0 pg   MCHC 34.9  30.0 - 36.0 g/dL   RDW 16.1 (*) 11.5 - 15.5 %   Platelets 183  150 - 400 K/uL  GLUCOSE, CAPILLARY      Result Value Ref Range   Glucose-Capillary 70  70 - 99 mg/dL   Comment 1 Documented in Chart     Comment 2 Notify RN    GLUCOSE, CAPILLARY      Result Value Ref Range   Glucose-Capillary 80  70 - 99 mg/dL  FERRITIN      Result Value Ref Range   Ferritin 24  10 - 291 ng/mL  IRON      Result Value Ref Range   Iron 58  42 - 135 ug/dL  GLUCOSE, CAPILLARY      Result Value Ref Range   Glucose-Capillary 139 (*) 70 - 99 mg/dL   Comment 1 Documented in Chart     Comment 2 Notify RN    CBC      Result Value Ref Range   WBC 8.8  4.0 - 10.5 K/uL   RBC 2.92 (*) 3.87 - 5.11 MIL/uL   Hemoglobin 8.7 (*) 12.0 - 15.0 g/dL   HCT 25.8 (*) 36.0 - 46.0 %   MCV 88.4  78.0 - 100.0 fL   MCH 29.8  26.0 - 34.0 pg   MCHC 33.7  30.0 - 36.0 g/dL   RDW 15.9 (*) 11.5 - 15.5 %   Platelets 161  150 - 400 K/uL  GLUCOSE, CAPILLARY      Result Value Ref Range   Glucose-Capillary 173 (*) 70 - 99 mg/dL  GLUCOSE, CAPILLARY      Result Value Ref Range   Glucose-Capillary 102 (*) 70 - 99 mg/dL  GLUCOSE, CAPILLARY      Result Value Ref Range   Glucose-Capillary 100 (*) 70 - 99 mg/dL  GLUCOSE, CAPILLARY      Result Value Ref Range   Glucose-Capillary 153 (*) 70 - 99 mg/dL  GLUCOSE, CAPILLARY      Result Value Ref Range   Glucose-Capillary 145 (*) 70 - 99 mg/dL  BASIC METABOLIC PANEL      Result Value Ref Range   Sodium 139  135 - 145 mEq/L   Potassium 4.3  3.5 -  5.1 mEq/L   Chloride 110  96 - 112 mEq/L   CO2 22  19 - 32 mEq/L   Glucose, Bld 148 (*) 70 - 99 mg/dL   BUN 42 (*) 6 - 23 mg/dL   Creatinine, Ser 1.53 (*) 0.50 - 1.10 mg/dL   Calcium 7.5 (*) 8.4 - 10.5 mg/dL   GFR calc non Af Amer 29 (*) >90 mL/min   GFR calc Af Amer 34 (*) >90 mL/min  GLUCOSE, CAPILLARY      Result Value Ref Range   Glucose-Capillary 141 (*) 70 - 99 mg/dL  GLUCOSE, CAPILLARY       Result Value Ref Range   Glucose-Capillary 140 (*) 70 - 99 mg/dL   Comment 1 Documented in Chart     Comment 2 Notify RN    CBC      Result Value Ref Range   WBC 9.0  4.0 - 10.5 K/uL   RBC 2.18 (*) 3.87 - 5.11 MIL/uL   Hemoglobin 6.7 (*) 12.0 - 15.0 g/dL   HCT 19.6 (*) 36.0 - 46.0 %   MCV 89.9  78.0 - 100.0 fL   MCH 30.7  26.0 - 34.0 pg   MCHC 34.2  30.0 - 36.0 g/dL   RDW 16.1 (*) 11.5 - 15.5 %   Platelets 151  150 - 400 K/uL  GLUCOSE, CAPILLARY      Result Value Ref Range   Glucose-Capillary 114 (*) 70 - 99 mg/dL  GLUCOSE, CAPILLARY      Result Value Ref Range   Glucose-Capillary 89  70 - 99 mg/dL  CBC      Result Value Ref Range   WBC 11.9 (*) 4.0 - 10.5 K/uL   RBC 4.03  3.87 - 5.11 MIL/uL   Hemoglobin 12.0  12.0 - 15.0 g/dL   HCT 34.6 (*) 36.0 - 46.0 %   MCV 85.9  78.0 - 100.0 fL   MCH 29.8  26.0 - 34.0 pg   MCHC 34.7  30.0 - 36.0 g/dL   RDW 15.7 (*) 11.5 - 15.5 %   Platelets 129 (*) 150 - 400 K/uL  GLUCOSE, CAPILLARY      Result Value Ref Range   Glucose-Capillary 140 (*) 70 - 99 mg/dL  BASIC METABOLIC PANEL      Result Value Ref Range   Sodium 138  135 - 145 mEq/L   Potassium 4.8  3.5 - 5.1 mEq/L   Chloride 106  96 - 112 mEq/L   CO2 22  19 - 32 mEq/L   Glucose, Bld 84  70 - 99 mg/dL   BUN 32 (*) 6 - 23 mg/dL   Creatinine, Ser 1.46 (*) 0.50 - 1.10 mg/dL   Calcium 7.9 (*) 8.4 - 10.5 mg/dL   GFR calc non Af Amer 31 (*) >90 mL/min   GFR calc Af Amer 36 (*) >90 mL/min  GLUCOSE, CAPILLARY      Result Value Ref Range   Glucose-Capillary 213 (*) 70 - 99 mg/dL  GLUCOSE, CAPILLARY      Result Value Ref Range   Glucose-Capillary 76  70 - 99 mg/dL   Comment 1 Documented in Chart     Comment 2 Notify RN    GLUCOSE, CAPILLARY      Result Value Ref Range   Glucose-Capillary 65 (*) 70 - 99 mg/dL  GLUCOSE, CAPILLARY      Result Value Ref Range   Glucose-Capillary 90  70 - 99 mg/dL  GLUCOSE, CAPILLARY  Result Value Ref Range   Glucose-Capillary 112 (*) 70 - 99  mg/dL  GLUCOSE, CAPILLARY      Result Value Ref Range   Glucose-Capillary 85  70 - 99 mg/dL  GLUCOSE, CAPILLARY      Result Value Ref Range   Glucose-Capillary 159 (*) 70 - 99 mg/dL  GLUCOSE, CAPILLARY      Result Value Ref Range   Glucose-Capillary 255 (*) 70 - 99 mg/dL  CBC      Result Value Ref Range   WBC 6.9  4.0 - 10.5 K/uL   RBC 3.39 (*) 3.87 - 5.11 MIL/uL   Hemoglobin 10.4 (*) 12.0 - 15.0 g/dL   HCT 29.8 (*) 36.0 - 46.0 %   MCV 87.9  78.0 - 100.0 fL   MCH 30.7  26.0 - 34.0 pg   MCHC 34.9  30.0 - 36.0 g/dL   RDW 16.0 (*) 11.5 - 15.5 %   Platelets 129 (*) 150 - 400 K/uL  GLUCOSE, CAPILLARY      Result Value Ref Range   Glucose-Capillary 222 (*) 70 - 99 mg/dL  GLUCOSE, CAPILLARY      Result Value Ref Range   Glucose-Capillary 94  70 - 99 mg/dL  GLUCOSE, CAPILLARY      Result Value Ref Range   Glucose-Capillary 129 (*) 70 - 99 mg/dL  OCCULT BLOOD, POC DEVICE      Result Value Ref Range   Fecal Occult Bld POSITIVE (*) NEGATIVE  POCT I-STAT TROPONIN I      Result Value Ref Range   Troponin i, poc 0.00  0.00 - 0.08 ng/mL   Comment 3           TYPE AND SCREEN      Result Value Ref Range   ABO/RH(D) O NEG     Antibody Screen POS     Sample Expiration 12/21/2012     Antibody Identification ANTI-D     DAT, IgG NEG     Unit Number [id number]     Blood Component Type RED CELLS,LR     Unit division 00     Status of Unit ISSUED,FINAL     Transfusion Status OK TO TRANSFUSE     Crossmatch Result COMPATIBLE     Unit Number [id number]     Blood Component Type RED CELLS,LR     Unit division 00     Status of Unit ISSUED,FINAL     Transfusion Status OK TO TRANSFUSE     Crossmatch Result COMPATIBLE     Unit Number [id number]     Blood Component Type RED CELLS,LR     Unit division 00     Status of Unit ISSUED,FINAL     Transfusion Status OK TO TRANSFUSE     Crossmatch Result COMPATIBLE     Unit Number [id number]     Blood Component Type RED CELLS,LR      Unit division 00     Status of Unit ISSUED,FINAL     Transfusion Status OK TO TRANSFUSE     Crossmatch Result COMPATIBLE     Unit Number [id number]     Blood Component Type RED CELLS,LR     Unit division 00     Status of Unit ISSUED,FINAL     Transfusion Status OK TO TRANSFUSE     Crossmatch Result COMPATIBLE    PREPARE RBC (CROSSMATCH)      Result Value Ref Range   Order Confirmation ORDER PROCESSED BY BLOOD BANK  PREPARE RBC (CROSSMATCH)      Result Value Ref Range   Order Confirmation ORDER PROCESSED BY BLOOD BANK    PREPARE RBC (CROSSMATCH)      Result Value Ref Range   Order Confirmation ORDER PROCESSED BY BLOOD BANK     Results for orders placed during the hospital encounter of 12/18/12  URINE CULTURE      Result Value Ref Range   Specimen Description URINE, CLEAN CATCH     Special Requests NONE     Culture  Setup Time       Value: 12/18/2012 20:00     Performed at [facility]   Culture       Value: INSIGNIFICANT GROWTH     Performed at [facility]   Report Status 12/19/2012 FINAL    CBC WITH DIFFERENTIAL      Result Value Ref Range   WBC 12.6 (*) 4.0 - 10.5 K/uL   RBC 2.08 (*) 3.87 - 5.11 MIL/uL   Hemoglobin 6.4 (*) 12.0 - 15.0 g/dL   HCT 18.9 (*) 36.0 - 46.0 %   MCV 90.9  78.0 - 100.0 fL   MCH 30.8  26.0 - 34.0 pg   MCHC 33.9  30.0 - 36.0 g/dL   RDW 16.0 (*) 11.5 - 15.5 %   Platelets 176  150 - 400 K/uL   Neutrophils Relative % 82 (*) 43 - 77 %   Neutro Abs 10.4 (*) 1.7 - 7.7 K/uL   Lymphocytes Relative 15  12 - 46 %   Lymphs Abs 1.9  0.7 - 4.0 K/uL   Monocytes Relative 2 (*) 3 - 12 %   Monocytes Absolute 0.3  0.1 - 1.0 K/uL   Eosinophils Relative 1  0 - 5 %   Eosinophils Absolute 0.1  0.0 - 0.7 K/uL   Basophils Relative 0  0 - 1 %   Basophils Absolute 0.0  0.0 - 0.1 K/uL  COMPREHENSIVE METABOLIC PANEL      Result Value Ref Range   Sodium 137  135 - 145 mEq/L   Potassium 4.0  3.5 - 5.1 mEq/L   Chloride 105  96 - 112 mEq/L   CO2  20  19 - 32 mEq/L   Glucose, Bld 202 (*) 70 - 99 mg/dL   BUN 71 (*) 6 - 23 mg/dL   Creatinine, Ser 1.49 (*) 0.50 - 1.10 mg/dL   Calcium 8.7  8.4 - 10.5 mg/dL   Total Protein 5.6 (*) 6.0 - 8.3 g/dL   Albumin 3.1 (*) 3.5 - 5.2 g/dL   AST 17  0 - 37 U/L   ALT 13  0 - 35 U/L   Alkaline Phosphatase 28 (*) 39 - 117 U/L   Total Bilirubin 0.1 (*) 0.3 - 1.2 mg/dL   GFR calc non Af Amer 30 (*) >90 mL/min   GFR calc Af Amer 35 (*) >90 mL/min  PRO B NATRIURETIC PEPTIDE      Result Value Ref Range   Pro B Natriuretic peptide (BNP) 165.6  0 - 450 pg/mL  URINALYSIS, ROUTINE W REFLEX MICROSCOPIC      Result Value Ref Range   Color, Urine YELLOW  YELLOW   APPearance CLOUDY (*) CLEAR   Specific Gravity, Urine 1.018  1.005 - 1.030   pH 5.0  5.0 - 8.0   Glucose, UA NEGATIVE  NEGATIVE mg/dL   Hgb urine dipstick LARGE (*) NEGATIVE   Bilirubin Urine NEGATIVE  NEGATIVE   Ketones,  ur NEGATIVE  NEGATIVE mg/dL   Protein, ur NEGATIVE  NEGATIVE mg/dL   Urobilinogen, UA 0.2  0.0 - 1.0 mg/dL   Nitrite NEGATIVE  NEGATIVE   Leukocytes, UA LARGE (*) NEGATIVE  PROTIME-INR      Result Value Ref Range   Prothrombin Time 14.3  11.6 - 15.2 seconds   INR 1.13  0.00 - 1.49  APTT      Result Value Ref Range   aPTT 28  24 - 37 seconds  URINE MICROSCOPIC-ADD ON      Result Value Ref Range   Squamous Epithelial / LPF RARE  RARE   WBC, UA 11-20  <3 WBC/hpf   RBC / HPF 3-6  <3 RBC/hpf   Bacteria, UA FEW (*) RARE  TSH      Result Value Ref Range   TSH 0.658  0.350 - 4.500 uIU/mL  TROPONIN I      Result Value Ref Range   Troponin I <0.30  <0.30 ng/mL  TROPONIN I      Result Value Ref Range   Troponin I <0.30  <0.30 ng/mL  TROPONIN I      Result Value Ref Range   Troponin I <0.30  <0.30 ng/mL  HEMOGLOBIN A1C      Result Value Ref Range   Hemoglobin A1C 6.8 (*) <5.7 %   Mean Plasma Glucose 148 (*) <117 mg/dL  BASIC METABOLIC PANEL      Result Value Ref Range   Sodium 142  135 - 145 mEq/L   Potassium 4.6   3.5 - 5.1 mEq/L   Chloride 110  96 - 112 mEq/L   CO2 23  19 - 32 mEq/L   Glucose, Bld 67 (*) 70 - 99 mg/dL   BUN 56 (*) 6 - 23 mg/dL   Creatinine, Ser 1.42 (*) 0.50 - 1.10 mg/dL   Calcium 8.8  8.4 - 10.5 mg/dL   GFR calc non Af Amer 32 (*) >90 mL/min   GFR calc Af Amer 37 (*) >90 mL/min  CBC      Result Value Ref Range   WBC 9.3  4.0 - 10.5 K/uL   RBC 3.04 (*) 3.87 - 5.11 MIL/uL   Hemoglobin 9.3 (*) 12.0 - 15.0 g/dL   HCT 26.3 (*) 36.0 - 46.0 %   MCV 86.5  78.0 - 100.0 fL   MCH 30.6  26.0 - 34.0 pg   MCHC 35.4  30.0 - 36.0 g/dL   RDW 15.5  11.5 - 15.5 %   Platelets 155  150 - 400 K/uL  GLUCOSE, CAPILLARY      Result Value Ref Range   Glucose-Capillary 199 (*) 70 - 99 mg/dL   Comment 1 Documented in Chart     Comment 2 Notify RN    GLUCOSE, CAPILLARY      Result Value Ref Range   Glucose-Capillary 168 (*) 70 - 99 mg/dL   Comment 1 Notify RN    CBC      Result Value Ref Range   WBC 9.5  4.0 - 10.5 K/uL   RBC 3.11 (*) 3.87 - 5.11 MIL/uL   Hemoglobin 9.5 (*) 12.0 - 15.0 g/dL   HCT 27.1 (*) 36.0 - 46.0 %   MCV 87.1  78.0 - 100.0 fL   MCH 30.5  26.0 - 34.0 pg   MCHC 35.1  30.0 - 36.0 g/dL   RDW 15.9 (*) 11.5 - 15.5 %   Platelets 169  150 - 400 K/uL  CBC      Result Value Ref Range   WBC 9.6  4.0 - 10.5 K/uL   RBC 3.14 (*) 3.87 - 5.11 MIL/uL   Hemoglobin 9.6 (*) 12.0 - 15.0 g/dL   HCT 27.5 (*) 36.0 - 46.0 %   MCV 87.6  78.0 - 100.0 fL   MCH 30.6  26.0 - 34.0 pg   MCHC 34.9  30.0 - 36.0 g/dL   RDW 16.1 (*) 11.5 - 15.5 %   Platelets 183  150 - 400 K/uL  GLUCOSE, CAPILLARY      Result Value Ref Range   Glucose-Capillary 70  70 - 99 mg/dL   Comment 1 Documented in Chart     Comment 2 Notify RN    GLUCOSE, CAPILLARY      Result Value Ref Range   Glucose-Capillary 80  70 - 99 mg/dL  FERRITIN      Result Value Ref Range   Ferritin 24  10 - 291 ng/mL  IRON      Result Value Ref Range   Iron 58  42 - 135 ug/dL  GLUCOSE, CAPILLARY      Result Value Ref Range    Glucose-Capillary 139 (*) 70 - 99 mg/dL   Comment 1 Documented in Chart     Comment 2 Notify RN    CBC      Result Value Ref Range   WBC 8.8  4.0 - 10.5 K/uL   RBC 2.92 (*) 3.87 - 5.11 MIL/uL   Hemoglobin 8.7 (*) 12.0 - 15.0 g/dL   HCT 25.8 (*) 36.0 - 46.0 %   MCV 88.4  78.0 - 100.0 fL   MCH 29.8  26.0 - 34.0 pg   MCHC 33.7  30.0 - 36.0 g/dL   RDW 15.9 (*) 11.5 - 15.5 %   Platelets 161  150 - 400 K/uL  GLUCOSE, CAPILLARY      Result Value Ref Range   Glucose-Capillary 173 (*) 70 - 99 mg/dL  GLUCOSE, CAPILLARY      Result Value Ref Range   Glucose-Capillary 102 (*) 70 - 99 mg/dL  GLUCOSE, CAPILLARY      Result Value Ref Range   Glucose-Capillary 100 (*) 70 - 99 mg/dL  GLUCOSE, CAPILLARY      Result Value Ref Range   Glucose-Capillary 153 (*) 70 - 99 mg/dL  GLUCOSE, CAPILLARY      Result Value Ref Range   Glucose-Capillary 145 (*) 70 - 99 mg/dL  BASIC METABOLIC PANEL      Result Value Ref Range   Sodium 139  135 - 145 mEq/L   Potassium 4.3  3.5 - 5.1 mEq/L   Chloride 110  96 - 112 mEq/L   CO2 22  19 - 32 mEq/L   Glucose, Bld 148 (*) 70 - 99 mg/dL   BUN 42 (*) 6 - 23 mg/dL   Creatinine, Ser 1.53 (*) 0.50 - 1.10 mg/dL   Calcium 7.5 (*) 8.4 - 10.5 mg/dL   GFR calc non Af Amer 29 (*) >90 mL/min   GFR calc Af Amer 34 (*) >90 mL/min  GLUCOSE, CAPILLARY      Result Value Ref Range   Glucose-Capillary 141 (*) 70 - 99 mg/dL  GLUCOSE, CAPILLARY      Result Value Ref Range   Glucose-Capillary 140 (*) 70 - 99 mg/dL   Comment 1 Documented in Chart     Comment 2 Notify RN    CBC  Result Value Ref Range   WBC 9.0  4.0 - 10.5 K/uL   RBC 2.18 (*) 3.87 - 5.11 MIL/uL   Hemoglobin 6.7 (*) 12.0 - 15.0 g/dL   HCT 19.6 (*) 36.0 - 46.0 %   MCV 89.9  78.0 - 100.0 fL   MCH 30.7  26.0 - 34.0 pg   MCHC 34.2  30.0 - 36.0 g/dL   RDW 16.1 (*) 11.5 - 15.5 %   Platelets 151  150 - 400 K/uL  GLUCOSE, CAPILLARY      Result Value Ref Range   Glucose-Capillary 114 (*) 70 - 99 mg/dL   GLUCOSE, CAPILLARY      Result Value Ref Range   Glucose-Capillary 89  70 - 99 mg/dL  CBC      Result Value Ref Range   WBC 11.9 (*) 4.0 - 10.5 K/uL   RBC 4.03  3.87 - 5.11 MIL/uL   Hemoglobin 12.0  12.0 - 15.0 g/dL   HCT 34.6 (*) 36.0 - 46.0 %   MCV 85.9  78.0 - 100.0 fL   MCH 29.8  26.0 - 34.0 pg   MCHC 34.7  30.0 - 36.0 g/dL   RDW 15.7 (*) 11.5 - 15.5 %   Platelets 129 (*) 150 - 400 K/uL  GLUCOSE, CAPILLARY      Result Value Ref Range   Glucose-Capillary 140 (*) 70 - 99 mg/dL  BASIC METABOLIC PANEL      Result Value Ref Range   Sodium 138  135 - 145 mEq/L   Potassium 4.8  3.5 - 5.1 mEq/L   Chloride 106  96 - 112 mEq/L   CO2 22  19 - 32 mEq/L   Glucose, Bld 84  70 - 99 mg/dL   BUN 32 (*) 6 - 23 mg/dL   Creatinine, Ser 1.46 (*) 0.50 - 1.10 mg/dL   Calcium 7.9 (*) 8.4 - 10.5 mg/dL   GFR calc non Af Amer 31 (*) >90 mL/min   GFR calc Af Amer 36 (*) >90 mL/min  GLUCOSE, CAPILLARY      Result Value Ref Range   Glucose-Capillary 213 (*) 70 - 99 mg/dL  GLUCOSE, CAPILLARY      Result Value Ref Range   Glucose-Capillary 76  70 - 99 mg/dL   Comment 1 Documented in Chart     Comment 2 Notify RN    GLUCOSE, CAPILLARY      Result Value Ref Range   Glucose-Capillary 65 (*) 70 - 99 mg/dL  GLUCOSE, CAPILLARY      Result Value Ref Range   Glucose-Capillary 90  70 - 99 mg/dL  GLUCOSE, CAPILLARY      Result Value Ref Range   Glucose-Capillary 112 (*) 70 - 99 mg/dL  GLUCOSE, CAPILLARY      Result Value Ref Range   Glucose-Capillary 85  70 - 99 mg/dL  GLUCOSE, CAPILLARY      Result Value Ref Range   Glucose-Capillary 159 (*) 70 - 99 mg/dL  GLUCOSE, CAPILLARY      Result Value Ref Range   Glucose-Capillary 255 (*) 70 - 99 mg/dL  CBC      Result Value Ref Range   WBC 6.9  4.0 - 10.5 K/uL   RBC 3.39 (*) 3.87 - 5.11 MIL/uL   Hemoglobin 10.4 (*) 12.0 - 15.0 g/dL   HCT 29.8 (*) 36.0 - 46.0 %   MCV 87.9  78.0 - 100.0 fL   MCH 30.7  26.0 - 34.0 pg  MCHC 34.9  30.0 - 36.0 g/dL    RDW 16.0 (*) 11.5 - 15.5 %   Platelets 129 (*) 150 - 400 K/uL  GLUCOSE, CAPILLARY      Result Value Ref Range   Glucose-Capillary 222 (*) 70 - 99 mg/dL  GLUCOSE, CAPILLARY      Result Value Ref Range   Glucose-Capillary 94  70 - 99 mg/dL  GLUCOSE, CAPILLARY      Result Value Ref Range   Glucose-Capillary 129 (*) 70 - 99 mg/dL  OCCULT BLOOD, POC DEVICE      Result Value Ref Range   Fecal Occult Bld POSITIVE (*) NEGATIVE  POCT I-STAT TROPONIN I      Result Value Ref Range   Troponin i, poc 0.00  0.00 - 0.08 ng/mL   Comment 3           TYPE AND SCREEN      Result Value Ref Range   ABO/RH(D) O NEG     Antibody Screen POS     Sample Expiration 12/21/2012     Antibody Identification ANTI-D     DAT, IgG NEG     Unit Number [id number]     Blood Component Type RED CELLS,LR     Unit division 00     Status of Unit ISSUED,FINAL     Transfusion Status OK TO TRANSFUSE     Crossmatch Result COMPATIBLE     Unit Number [id number]     Blood Component Type RED CELLS,LR     Unit division 00     Status of Unit ISSUED,FINAL     Transfusion Status OK TO TRANSFUSE     Crossmatch Result COMPATIBLE     Unit Number [id number]     Blood Component Type RED CELLS,LR     Unit division 00     Status of Unit ISSUED,FINAL     Transfusion Status OK TO TRANSFUSE     Crossmatch Result COMPATIBLE     Unit Number [id number]     Blood Component Type RED CELLS,LR     Unit division 00     Status of Unit ISSUED,FINAL     Transfusion Status OK TO TRANSFUSE     Crossmatch Result COMPATIBLE     Unit Number [id number]     Blood Component Type RED CELLS,LR     Unit division 00     Status of Unit ISSUED,FINAL     Transfusion Status OK TO TRANSFUSE     Crossmatch Result COMPATIBLE    PREPARE RBC (CROSSMATCH)      Result Value Ref Range   Order Confirmation ORDER PROCESSED BY BLOOD BANK    PREPARE RBC (CROSSMATCH)      Result Value Ref Range   Order Confirmation ORDER PROCESSED BY BLOOD BANK     PREPARE RBC (CROSSMATCH)      Result Value Ref Range   Order Confirmation ORDER PROCESSED BY BLOOD BANK     Dg Lumbar Spine Complete  04/09/2013   CLINICAL DATA:  Fall.  Low back pain.  Right lower extremity pain.  EXAM: LUMBAR SPINE - COMPLETE 4+ VIEW  COMPARISON:  None.  FINDINGS: Bony demineralization noted. Facet arthropathy is present bilaterally at L4-5 and L5-S1. Aortoiliac atherosclerotic calcification is present.  There 7 mm of degenerative anterolisthesis at L4-5 with facet arthropathy but without visible pars defects. Loss of disc height noted at L5-S1.  No lumbar fracture is identified.  IMPRESSION: 1. Prominent bony demineralization.  No lumbar fracture identified.  2. Lower lumbar facet arthropathy, with 7 mm of degenerative anterolisthesis at L4-5. 3. Atherosclerosis.   Electronically Signed   By: Sherryl Barters M.D.   On: 04/09/2013 20:23   Dg Hip Bilateral W/pelvis  04/09/2013   CLINICAL DATA:  Fall.  Right hip and buttock pain.  EXAM: BILATERAL HIP WITH PELVIS - 4+ VIEW  COMPARISON:  None.  FINDINGS: Severe bilateral degenerative hip arthropathy, particularly on the left. I do not observe a definite fracture.  IMPRESSION: 1. Severe osteoarthritis of both hips. I do not observe a definite fracture. Spurring extends along the femoral necks causing slight cortical irregularity ; if there is a high clinical suspicion of occult fracture then cross-sectional imaging would be recommended.   Electronically Signed   By: Sherryl Barters M.D.   On: 04/09/2013 20:21   Ct Hip Right Wo Contrast  04/09/2013   CLINICAL DATA:  Right hip and buttock region pain after a fall last evening. Radiographs show arthropathic changes but no definitive fracture.  EXAM: CT OF THE RIGHT HIP WITHOUT CONTRAST  TECHNIQUE: Multidetector CT imaging was performed according to the standard protocol. Multiplanar CT image reconstructions were also generated.  COMPARISON:  Current hip radiographs  FINDINGS: No fracture.  As  noted radiographically, there are advanced arthropathic changes of the right hip with superior lateral joint space narrowing, marginal osteophytes from the base of the femoral head and from the rim of the acetabulum, as well as mild subchondral cystic change and subtle sclerosis.  No joint effusion. The luteal, flexor and adductor muscles surrounding the hip are unremarkable. No evidence of a tendon rupture or muscle hematoma. No subcutaneous hematoma.  Within the pelvis, note is made of multiple sigmoid diverticula. No diverticulitis no pelvic masses or abnormal fluid collections.  IMPRESSION: 1. No fracture. 2. Advanced arthropathic changes of the right hip as detail. 3. No evidence of a muscle or tendon injury or hematoma.   Electronically Signed   By: Lajean Manes M.D.   On: 04/09/2013 21:50       No results found.   EKG Interpretation None      MDM   Final diagnoses:  Fall  Hip pain, right  Back pain   Patient status post fall at home while reaching for her walker. Patient landed on her right hip right foot area. Patient with complaint of low back pain and right hip pain. No other complaints. Extensive workup here to include x-rays of the lumbar area bilateral x-rays of the hip and pelvis and CT scan of the right hip negative. Patient we discharged home with followup with her primary care Dr. Tramadol for pain.    I personally performed the services described in this documentation, which was scribed in my presence. The recorded information has been reviewed and is accurate.      Mervin Kung, MD 04/09/13 2218

## 2013-04-09 NOTE — ED Notes (Signed)
Pt is observed to be sitting on the edge of the stretcher with her right hip off the mattress.  Patient is insisting on sitting like this for comfort and will not position herself fully onto the stretcher.  I have advised her and her visitor of the risk of falling, but she remains in this position.  Her visitor stated 'I'm watching her.'  Patient is alert, oriented.  I have informed the charge nurse, [place].

## 2013-04-14 ENCOUNTER — Other Ambulatory Visit: Payer: Self-pay | Admitting: Internal Medicine

## 2013-04-14 NOTE — Telephone Encounter (Signed)
Left message on VM informing patient to contact pharmacy or PCP for refill request

## 2013-05-16 ENCOUNTER — Other Ambulatory Visit: Payer: Self-pay | Admitting: Internal Medicine

## 2013-05-19 ENCOUNTER — Other Ambulatory Visit: Payer: Self-pay | Admitting: Internal Medicine

## 2013-06-01 ENCOUNTER — Other Ambulatory Visit: Payer: Self-pay | Admitting: Geriatric Medicine

## 2013-06-01 DIAGNOSIS — R9389 Abnormal findings on diagnostic imaging of other specified body structures: Secondary | ICD-10-CM

## 2013-06-05 ENCOUNTER — Ambulatory Visit
Admission: RE | Admit: 2013-06-05 | Discharge: 2013-06-05 | Disposition: A | Discharge status: Home / Self Care | Payer: Medicare Other | Source: Ambulatory Visit | Attending: Geriatric Medicine | Admitting: Geriatric Medicine

## 2013-06-05 DIAGNOSIS — R9389 Abnormal findings on diagnostic imaging of other specified body structures: Secondary | ICD-10-CM

## 2013-08-02 DEATH — deceased

## 2015-02-25 IMAGING — CT CT CHEST W/O CM
3 of 4 series · 16 of 30 positions shown, 18 images · non-contrast
Comparison: Chest x-ray 12/18/2012

CLINICAL DATA: Abnormal chest x-ray.  Prominent right hilum.

EXAM:
CT CHEST WITHOUT CONTRAST
TECHNIQUE: Multidetector CT imaging of the chest was performed following the
standard protocol without IV contrast.

[Series 3: chest w/o · axial · non-contrast · 0.67mm/px · z∈[-226,-46]mm · 5 of 56 slices shown, 7 images]
[im 10/56  mediastinal]
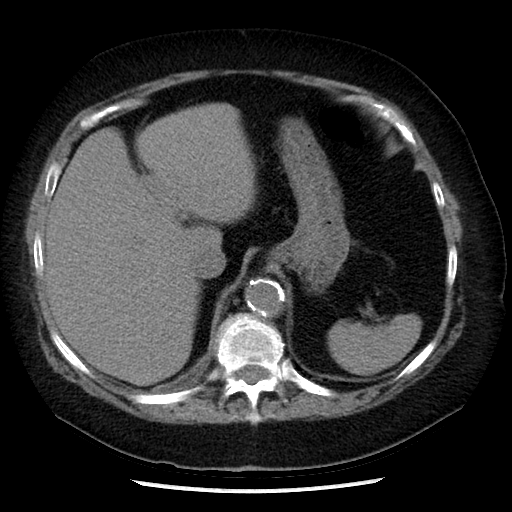
[im 10/56  lung]
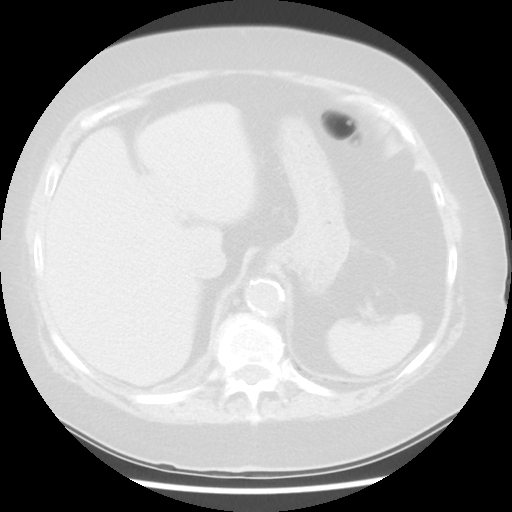
[im 19/56  lung]
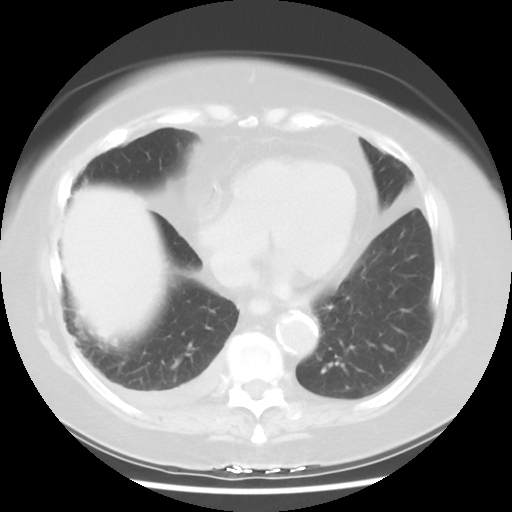
[im 28/56  lung]
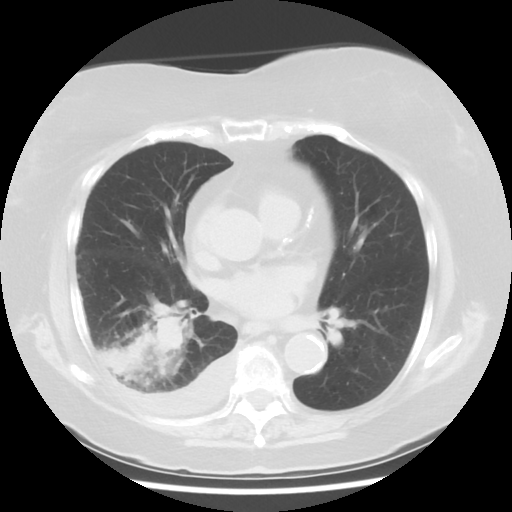
[im 37/56  lung]
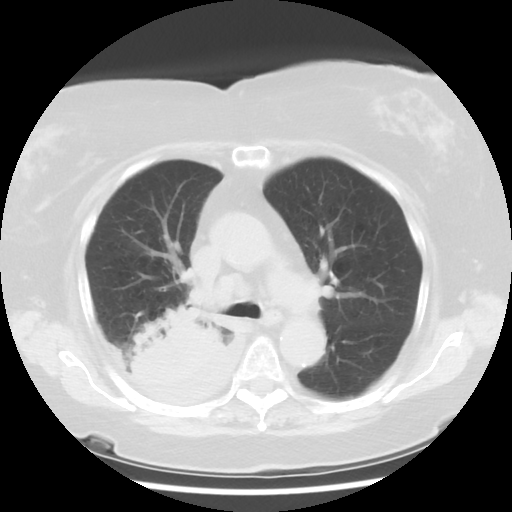
[im 46/56  mediastinal]
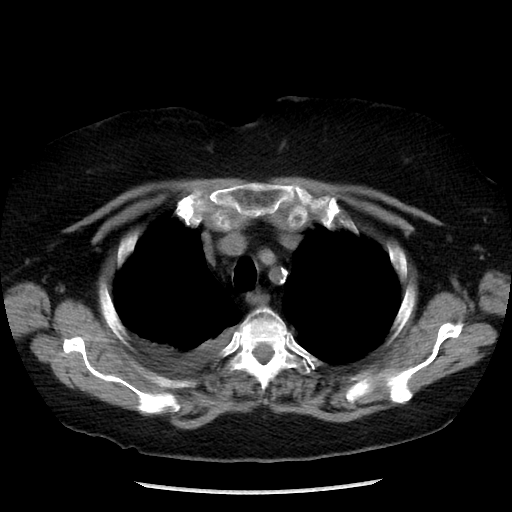
[im 46/56  lung]
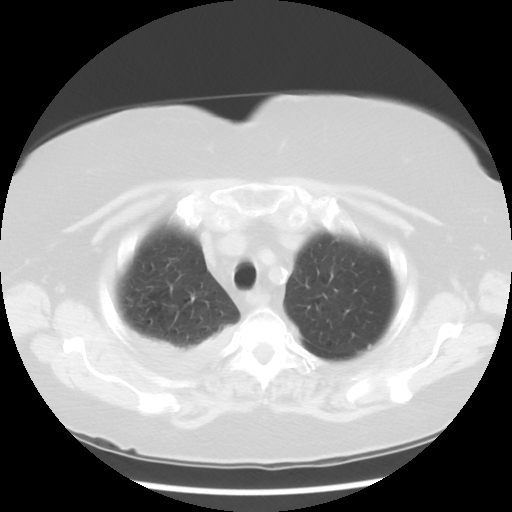

[Series 4: lung windows · axial · 0.67mm/px · z∈[-216,-51]mm · 4 of 56 slices shown]
[im 12/56  lung]
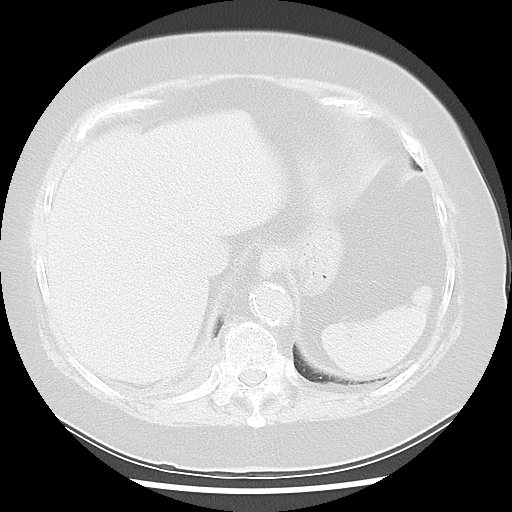
[im 23/56  lung]
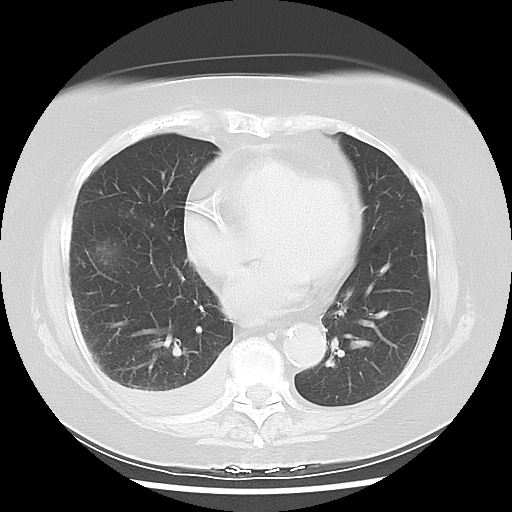
[im 34/56  lung]
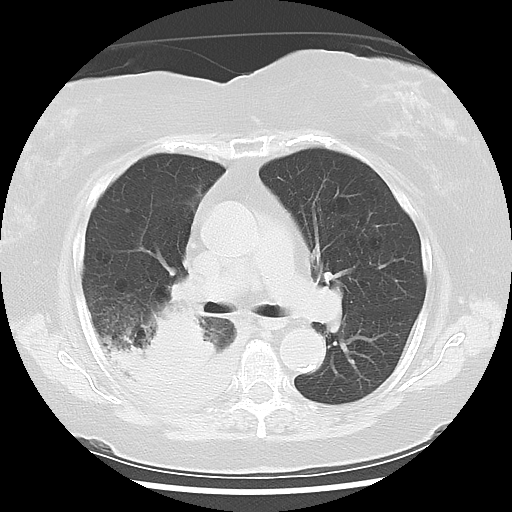
[im 45/56  lung]
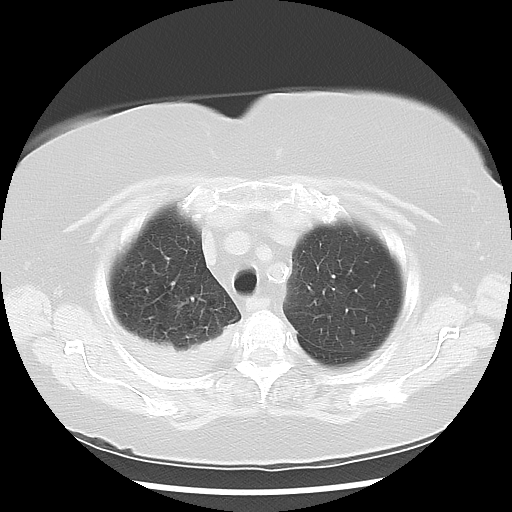

[Series 602: sagittal body · sagittal · 0.67mm/px · 7 of 138 slices shown]
[im 10/138  mediastinal]
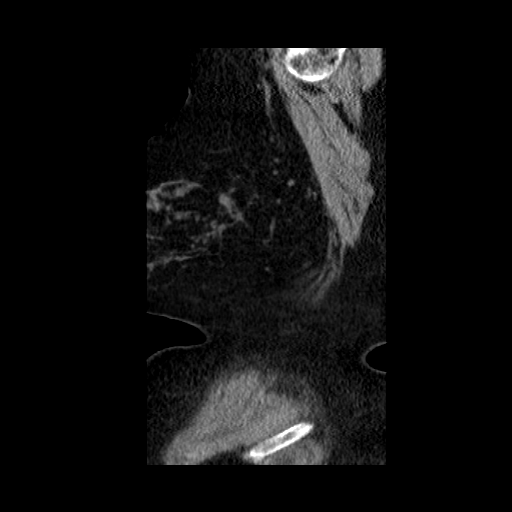
[im 30/138  mediastinal]
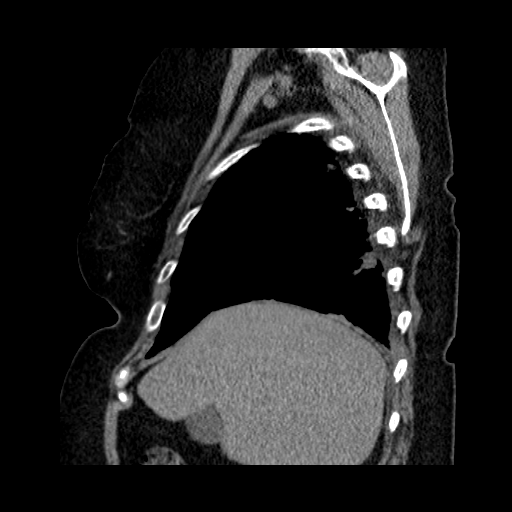
[im 49/138  mediastinal]
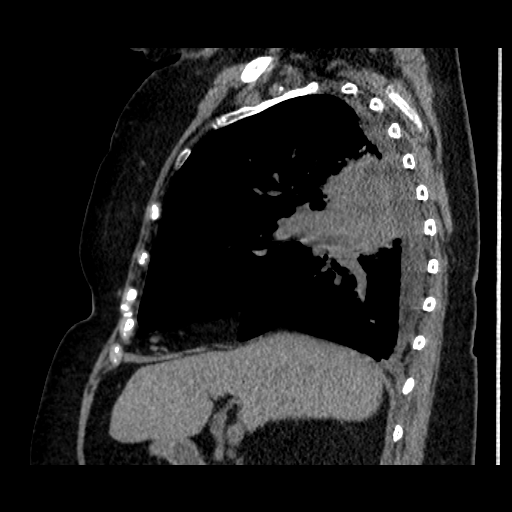
[im 59/138  mediastinal]
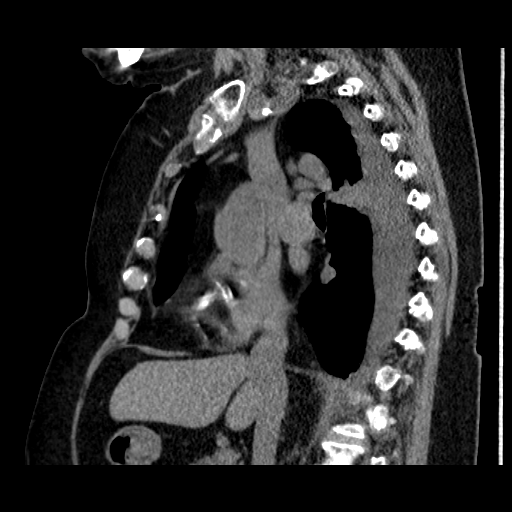
[im 79/138  mediastinal]
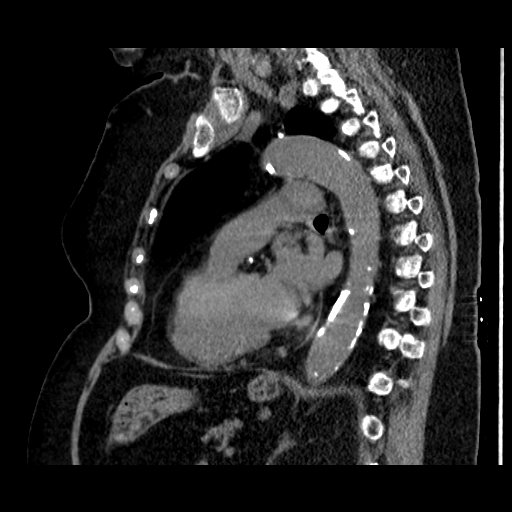
[im 89/138  mediastinal]
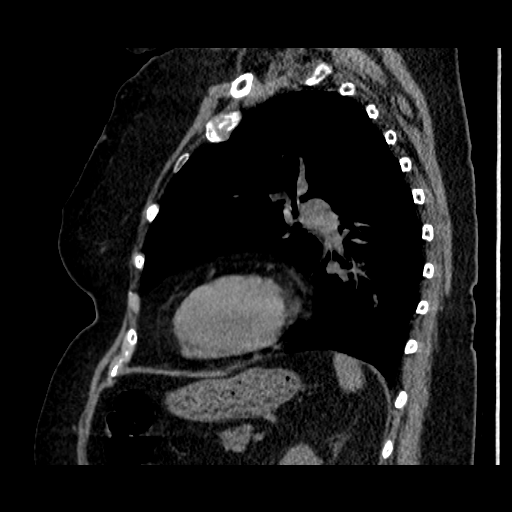
[im 108/138  mediastinal]
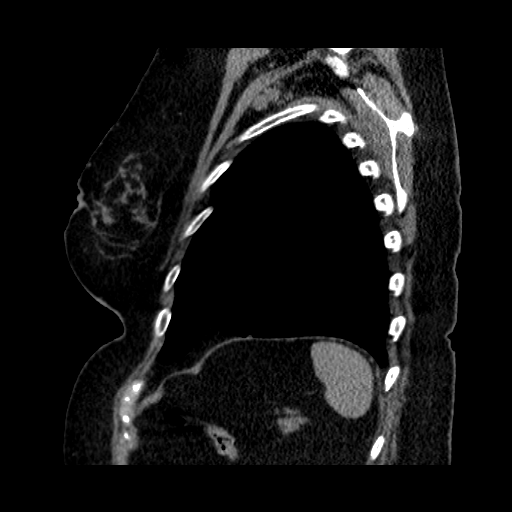

[16 of 30 positions shown; findings below may reference images not displayed]

FINDINGS: There is dense consolidation noted in the superior segment of the
right lower lobe. There is suspicion for a central right lower lobe
mass. This is difficult to measure with the extensive airspace
disease in the adjacent right lower lobe and without intravenous
contrast. A masslike area on image 19 of series 3 measures 3.0 x
cm. Possible masslike area on image 25 measures approximately 2.2 cm
in cross-sectional diameter. Again, it is very difficult to separate
central mass lesion from postobstructive process.

Associated small right pleural effusion. Numerous subpleural nodular
densities are noted along the right hemidiaphragm, the largest on
image 37 of series 4 measuring 13 mm. Subpleural nodularity also
noted in the right upper lobe. Index lesion measures up to 10 mm
laterally on image 17. Right upper lobe nodule on image 14 measures
6 mm.

Underlying mild COPD changes.  No pulmonary nodules on the left.

Right paratracheal adenopathy. Index node measures up to 17 mm in
short axis diameter on image 17. Pretracheal lymph node has a short
axis diameter of 13 mm on image 19.

Coronary artery calcifications within all 3 major coronary vessels.
Heart is normal size. Aortic calcifications without aneurysm.

No mediastinal, hilar, or axillary adenopathy.

Imaging into the upper abdomen shows and mildly enlarged upper
abdominal/celiac axis lymph node measuring 10 mm in short axis
diameter.

Review of the lung windows demonstrates mild compression fracture in
the mid thoracic spine, likely T8. This is stable since prior chest
x-ray.
IMPRESSION: Dense consolidative process in the superior segment of the right
lower lobe which I suspect is postobstructive related to a central
mass. Visualization and exact measurements are difficult without
intravenous contrast and with the adjacent airspace disease.
Consider further evaluation with PET CT or contrast-enhanced CT.

Adjacent subpleural nodularity in the right upper lobe and adjacent
to the right hemidiaphragm concerning for metastases.

Small right pleural effusion.

Associated mediastinal and upper abdominal adenopathy.

These results will be called to the ordering clinician or
representative by the Radiologist Assistant, and communication
documented in the PACS Dashboard.
